# Patient Record
Sex: Female | Born: 1935 | Race: White | Hispanic: No | Marital: Married | Smoking: Never smoker
Health system: Southern US, Community
[De-identification: ages and names within clinical notes are randomized; demographics above are authoritative.]

## PROBLEM LIST (undated history)

## (undated) DIAGNOSIS — M1990 Unspecified osteoarthritis, unspecified site: Secondary | ICD-10-CM

## (undated) DIAGNOSIS — G309 Alzheimer's disease, unspecified: Secondary | ICD-10-CM

## (undated) DIAGNOSIS — E039 Hypothyroidism, unspecified: Secondary | ICD-10-CM

## (undated) DIAGNOSIS — E079 Disorder of thyroid, unspecified: Secondary | ICD-10-CM

## (undated) DIAGNOSIS — M109 Gout, unspecified: Secondary | ICD-10-CM

## (undated) DIAGNOSIS — R29898 Other symptoms and signs involving the musculoskeletal system: Secondary | ICD-10-CM

## (undated) DIAGNOSIS — F0280 Dementia in other diseases classified elsewhere without behavioral disturbance: Secondary | ICD-10-CM

## (undated) DIAGNOSIS — R413 Other amnesia: Secondary | ICD-10-CM

## (undated) DIAGNOSIS — C801 Malignant (primary) neoplasm, unspecified: Secondary | ICD-10-CM

## (undated) DIAGNOSIS — R32 Unspecified urinary incontinence: Secondary | ICD-10-CM

## (undated) DIAGNOSIS — M069 Rheumatoid arthritis, unspecified: Secondary | ICD-10-CM

## (undated) HISTORY — PX: OTHER SURGICAL HISTORY: SHX169

## (undated) HISTORY — PX: ABDOMINAL HYSTERECTOMY: SHX81

---

## 1972-10-09 HISTORY — PX: MASTECTOMY: SHX3

## 1972-10-09 HISTORY — PX: BREAST SURGERY: SHX581

## 2000-05-30 ENCOUNTER — Encounter: Admission: RE | Admit: 2000-05-30 | Discharge: 2000-07-09 | Payer: Self-pay | Admitting: Oncology

## 2000-06-18 ENCOUNTER — Other Ambulatory Visit: Admission: RE | Admit: 2000-06-18 | Discharge: 2000-06-18 | Payer: Self-pay | Admitting: Obstetrics and Gynecology

## 2002-05-16 ENCOUNTER — Other Ambulatory Visit: Admission: RE | Admit: 2002-05-16 | Discharge: 2002-05-16 | Payer: Self-pay | Admitting: Obstetrics and Gynecology

## 2002-07-04 ENCOUNTER — Encounter: Admission: RE | Admit: 2002-07-04 | Discharge: 2002-07-04 | Payer: Self-pay | Admitting: Obstetrics and Gynecology

## 2002-07-04 ENCOUNTER — Encounter: Payer: Self-pay | Admitting: Obstetrics and Gynecology

## 2004-06-23 ENCOUNTER — Other Ambulatory Visit: Admission: RE | Admit: 2004-06-23 | Discharge: 2004-06-23 | Payer: Self-pay | Admitting: Obstetrics and Gynecology

## 2006-02-23 ENCOUNTER — Ambulatory Visit: Payer: Self-pay | Admitting: Oncology

## 2006-03-01 ENCOUNTER — Encounter: Payer: Self-pay | Admitting: Vascular Surgery

## 2006-03-01 ENCOUNTER — Ambulatory Visit: Admission: RE | Admit: 2006-03-01 | Discharge: 2006-03-01 | Payer: Self-pay | Admitting: Oncology

## 2006-03-15 ENCOUNTER — Encounter: Admission: RE | Admit: 2006-03-15 | Discharge: 2006-03-15 | Payer: Self-pay | Admitting: Obstetrics and Gynecology

## 2006-04-12 ENCOUNTER — Encounter: Admission: RE | Admit: 2006-04-12 | Discharge: 2006-04-12 | Payer: Self-pay | Admitting: Oncology

## 2006-09-21 ENCOUNTER — Ambulatory Visit: Payer: Self-pay | Admitting: Oncology

## 2007-03-18 ENCOUNTER — Encounter: Admission: RE | Admit: 2007-03-18 | Discharge: 2007-03-18 | Payer: Self-pay | Admitting: Oncology

## 2007-09-20 ENCOUNTER — Ambulatory Visit: Payer: Self-pay | Admitting: Oncology

## 2007-09-24 LAB — CBC WITH DIFFERENTIAL/PLATELET
Basophils Absolute: 0 10*3/uL (ref 0.0–0.1)
Eosinophils Absolute: 0.1 10*3/uL (ref 0.0–0.5)
HCT: 43.4 % (ref 34.8–46.6)
HGB: 14.8 g/dL (ref 11.6–15.9)
LYMPH%: 23.4 % (ref 14.0–48.0)
MCH: 33.1 pg (ref 26.0–34.0)
MCV: 97 fL (ref 81.0–101.0)
MONO%: 9.4 % (ref 0.0–13.0)
NEUT#: 4.4 10*3/uL (ref 1.5–6.5)
NEUT%: 65.1 % (ref 39.6–76.8)
Platelets: 294 10*3/uL (ref 145–400)
RDW: 11.1 % — ABNORMAL LOW (ref 11.3–14.5)

## 2008-03-18 ENCOUNTER — Encounter: Admission: RE | Admit: 2008-03-18 | Discharge: 2008-03-18 | Payer: Self-pay | Admitting: Oncology

## 2008-03-27 ENCOUNTER — Encounter: Admission: RE | Admit: 2008-03-27 | Discharge: 2008-03-27 | Payer: Self-pay | Admitting: Oncology

## 2008-09-23 ENCOUNTER — Ambulatory Visit: Payer: Self-pay | Admitting: Oncology

## 2008-09-25 LAB — CBC WITH DIFFERENTIAL/PLATELET
Basophils Absolute: 0 10*3/uL (ref 0.0–0.1)
Eosinophils Absolute: 0.1 10*3/uL (ref 0.0–0.5)
HCT: 40.9 % (ref 34.8–46.6)
HGB: 13.7 g/dL (ref 11.6–15.9)
NEUT#: 6.1 10*3/uL (ref 1.5–6.5)
NEUT%: 76.7 % (ref 39.6–76.8)
RDW: 13 % (ref 11.3–14.5)
lymph#: 1.2 10*3/uL (ref 0.9–3.3)

## 2009-03-19 ENCOUNTER — Encounter: Admission: RE | Admit: 2009-03-19 | Discharge: 2009-03-19 | Payer: Self-pay | Admitting: Oncology

## 2009-07-14 ENCOUNTER — Encounter (HOSPITAL_COMMUNITY): Admission: RE | Admit: 2009-07-14 | Discharge: 2009-10-12 | Payer: Self-pay | Admitting: Rheumatology

## 2009-09-22 ENCOUNTER — Ambulatory Visit: Payer: Self-pay | Admitting: Oncology

## 2009-09-24 LAB — CBC WITH DIFFERENTIAL/PLATELET
BASO%: 0.2 % (ref 0.0–2.0)
Basophils Absolute: 0 10*3/uL (ref 0.0–0.1)
EOS%: 0.2 % (ref 0.0–7.0)
HCT: 48 % — ABNORMAL HIGH (ref 34.8–46.6)
HGB: 15.4 g/dL (ref 11.6–15.9)
MCH: 31 pg (ref 25.1–34.0)
MCHC: 32.1 g/dL (ref 31.5–36.0)
MONO#: 0.7 10*3/uL (ref 0.1–0.9)
NEUT%: 84.7 % — ABNORMAL HIGH (ref 38.4–76.8)
RDW: 14.1 % (ref 11.2–14.5)
WBC: 12.7 10*3/uL — ABNORMAL HIGH (ref 3.9–10.3)
lymph#: 1.2 10*3/uL (ref 0.9–3.3)

## 2009-11-08 ENCOUNTER — Encounter (HOSPITAL_COMMUNITY): Admission: RE | Admit: 2009-11-08 | Discharge: 2010-02-06 | Payer: Self-pay | Admitting: Rheumatology

## 2010-09-19 ENCOUNTER — Encounter
Admission: RE | Admit: 2010-09-19 | Discharge: 2010-09-19 | Payer: Self-pay | Source: Home / Self Care | Attending: Oncology | Admitting: Oncology

## 2010-10-22 ENCOUNTER — Inpatient Hospital Stay (HOSPITAL_COMMUNITY)
Admission: EM | Admit: 2010-10-22 | Discharge: 2010-10-25 | Payer: Self-pay | Source: Home / Self Care | Attending: Endocrinology | Admitting: Endocrinology

## 2010-10-24 LAB — CBC
HCT: 49.3 % — ABNORMAL HIGH (ref 36.0–46.0)
HCT: 42.5 % (ref 36.0–46.0)
Hemoglobin: 16.2 g/dL — ABNORMAL HIGH (ref 12.0–15.0)
Hemoglobin: 13.6 g/dL (ref 12.0–15.0)
MCH: 33.7 pg (ref 26.0–34.0)
MCH: 32.9 pg (ref 26.0–34.0)
MCHC: 32.9 g/dL (ref 30.0–36.0)
MCHC: 32 g/dL (ref 30.0–36.0)
MCV: 102.5 fL — ABNORMAL HIGH (ref 78.0–100.0)
MCV: 102.7 fL — ABNORMAL HIGH (ref 78.0–100.0)
Platelets: 378 10*3/uL (ref 150–400)
Platelets: 322 10*3/uL (ref 150–400)
RBC: 4.81 MIL/uL (ref 3.87–5.11)
RBC: 4.14 MIL/uL (ref 3.87–5.11)
RDW: 14.2 % (ref 11.5–15.5)
RDW: 14.2 % (ref 11.5–15.5)
WBC: 8.7 10*3/uL (ref 4.0–10.5)
WBC: 7.7 10*3/uL (ref 4.0–10.5)

## 2010-10-24 LAB — POCT CARDIAC MARKERS
CKMB, poc: <1 ng/mL — ABNORMAL LOW (ref 1.0–8.0)
Myoglobin, poc: 75.7 ng/mL (ref 12–200)
Troponin i, poc: <0.05 ng/mL (ref 0.00–0.09)

## 2010-10-24 LAB — COMPREHENSIVE METABOLIC PANEL
ALT: 12 U/L (ref 0–35)
AST: 22 U/L (ref 0–37)
Albumin: 3 g/dL — ABNORMAL LOW (ref 3.5–5.2)
Alkaline Phosphatase: 50 U/L (ref 39–117)
BUN: 9 mg/dL (ref 6–23)
CO2: 27 mEq/L (ref 19–32)
Calcium: 8.9 mg/dL (ref 8.4–10.5)
Chloride: 111 mEq/L (ref 96–112)
Creatinine, Ser: 0.92 mg/dL (ref 0.4–1.2)
GFR calc Af Amer: >60 mL/min (ref >60)
GFR calc non Af Amer: 59 mL/min — ABNORMAL LOW (ref >60)
Glucose, Bld: 90 mg/dL (ref 70–99)
Potassium: 3.9 mEq/L (ref 3.5–5.1)
Sodium: 143 mEq/L (ref 135–145)
Total Bilirubin: 0.6 mg/dL (ref 0.3–1.2)
Total Protein: 5.4 g/dL — ABNORMAL LOW (ref 6.0–8.3)

## 2010-10-24 LAB — TSH: TSH: 0.162 u[IU]/mL — ABNORMAL LOW (ref 0.350–4.500)

## 2010-10-24 LAB — URINE MICROSCOPIC-ADD ON

## 2010-10-24 LAB — POCT I-STAT, CHEM 8
BUN: 12 mg/dL (ref 6–23)
Calcium, Ion: 1.14 mmol/L (ref 1.12–1.32)
Chloride: 103 mEq/L (ref 96–112)
Creatinine, Ser: 1 mg/dL (ref 0.4–1.2)
Glucose, Bld: 116 mg/dL — ABNORMAL HIGH (ref 70–99)
HCT: 51 % — ABNORMAL HIGH (ref 36.0–46.0)
Hemoglobin: 17.3 g/dL — ABNORMAL HIGH (ref 12.0–15.0)
Potassium: 3.4 mEq/L — ABNORMAL LOW (ref 3.5–5.1)
Sodium: 143 mEq/L (ref 135–145)
TCO2: 28 mmol/L (ref 0–100)

## 2010-10-24 LAB — DIFFERENTIAL
Basophils Absolute: 0 10*3/uL (ref 0.0–0.1)
Basophils Relative: 0 % (ref 0–1)
Eosinophils Absolute: 0.1 10*3/uL (ref 0.0–0.7)
Eosinophils Relative: 1 % (ref 0–5)
Lymphocytes Relative: 16 % (ref 12–46)
Lymphs Abs: 1.4 10*3/uL (ref 0.7–4.0)
Monocytes Absolute: 1.2 10*3/uL — ABNORMAL HIGH (ref 0.1–1.0)
Monocytes Relative: 14 % — ABNORMAL HIGH (ref 3–12)
Neutro Abs: 6.1 10*3/uL (ref 1.7–7.7)
Neutrophils Relative %: 70 % (ref 43–77)

## 2010-10-24 LAB — URINALYSIS, ROUTINE W REFLEX MICROSCOPIC
Bilirubin Urine: NEGATIVE
Ketones, ur: 15 mg/dL — AB
Nitrite: POSITIVE — AB
Protein, ur: NEGATIVE mg/dL
Specific Gravity, Urine: >1.046 — ABNORMAL HIGH (ref 1.005–1.030)
Urine Glucose, Fasting: NEGATIVE mg/dL
Urobilinogen, UA: 0.2 mg/dL (ref 0.0–1.0)
pH: 7 (ref 5.0–8.0)

## 2010-10-24 LAB — PROTIME-INR
INR: 1 (ref 0.00–1.49)
Prothrombin Time: 13.4 seconds (ref 11.6–15.2)

## 2010-10-24 LAB — CORTISOL: Cortisol, Plasma: 14.9 ug/dL

## 2010-10-24 LAB — CORTISOL-AM, BLOOD: Cortisol - AM: 13.8 ug/dL (ref 4.3–22.4)

## 2010-10-26 LAB — BASIC METABOLIC PANEL
BUN: 8 mg/dL (ref 6–23)
CO2: 26 mEq/L (ref 19–32)
Calcium: 8.6 mg/dL (ref 8.4–10.5)
Chloride: 107 mEq/L (ref 96–112)
Creatinine, Ser: 0.87 mg/dL (ref 0.4–1.2)
GFR calc Af Amer: >60 mL/min (ref >60)
GFR calc non Af Amer: >60 mL/min (ref >60)
Glucose, Bld: 88 mg/dL (ref 70–99)
Potassium: 3.6 mEq/L (ref 3.5–5.1)
Sodium: 137 mEq/L (ref 135–145)

## 2010-10-26 LAB — CBC
HCT: 42.1 % (ref 36.0–46.0)
Hemoglobin: 13.4 g/dL (ref 12.0–15.0)
MCH: 32.7 pg (ref 26.0–34.0)
MCHC: 31.8 g/dL (ref 30.0–36.0)
MCV: 102.7 fL — ABNORMAL HIGH (ref 78.0–100.0)
Platelets: 306 10*3/uL (ref 150–400)
RBC: 4.1 MIL/uL (ref 3.87–5.11)
RDW: 14.3 % (ref 11.5–15.5)
WBC: 8 10*3/uL (ref 4.0–10.5)

## 2010-10-26 LAB — URINE CULTURE
Colony Count: >=100000
Culture  Setup Time: 201201142021

## 2010-11-06 ENCOUNTER — Inpatient Hospital Stay (HOSPITAL_COMMUNITY)
Admission: EM | Admit: 2010-11-06 | Discharge: 2010-11-14 | DRG: 641 | Disposition: A | Discharge status: Rehabilitation Facility | Payer: Medicare Other | Attending: Internal Medicine | Admitting: Internal Medicine

## 2010-11-06 DIAGNOSIS — M810 Age-related osteoporosis without current pathological fracture: Secondary | ICD-10-CM | POA: Diagnosis present

## 2010-11-06 DIAGNOSIS — Z853 Personal history of malignant neoplasm of breast: Secondary | ICD-10-CM

## 2010-11-06 DIAGNOSIS — R42 Dizziness and giddiness: Secondary | ICD-10-CM | POA: Diagnosis present

## 2010-11-06 DIAGNOSIS — G3184 Mild cognitive impairment, so stated: Secondary | ICD-10-CM | POA: Diagnosis present

## 2010-11-06 DIAGNOSIS — E039 Hypothyroidism, unspecified: Secondary | ICD-10-CM | POA: Diagnosis present

## 2010-11-06 DIAGNOSIS — R627 Adult failure to thrive: Principal | ICD-10-CM | POA: Diagnosis present

## 2010-11-06 DIAGNOSIS — M069 Rheumatoid arthritis, unspecified: Secondary | ICD-10-CM | POA: Diagnosis present

## 2010-11-06 DIAGNOSIS — K5900 Constipation, unspecified: Secondary | ICD-10-CM | POA: Diagnosis present

## 2010-11-06 DIAGNOSIS — R5381 Other malaise: Secondary | ICD-10-CM | POA: Diagnosis present

## 2010-11-06 DIAGNOSIS — E871 Hypo-osmolality and hyponatremia: Secondary | ICD-10-CM | POA: Diagnosis present

## 2010-11-06 DIAGNOSIS — R51 Headache: Secondary | ICD-10-CM | POA: Diagnosis present

## 2010-11-06 LAB — CBC
HCT: 45.1 % (ref 36.0–46.0)
Hemoglobin: 14.8 g/dL (ref 12.0–15.0)
MCH: 32.7 pg (ref 26.0–34.0)
MCHC: 32.8 g/dL (ref 30.0–36.0)
MCV: 99.8 fL (ref 78.0–100.0)
Platelets: 266 10*3/uL (ref 150–400)
RBC: 4.52 MIL/uL (ref 3.87–5.11)
RDW: 13.5 % (ref 11.5–15.5)
WBC: 6.3 10*3/uL (ref 4.0–10.5)

## 2010-11-06 LAB — DIFFERENTIAL
Basophils Absolute: 0 10*3/uL (ref 0.0–0.1)
Basophils Relative: 1 % (ref 0–1)
Eosinophils Absolute: 0 10*3/uL (ref 0.0–0.7)
Eosinophils Relative: 0 % (ref 0–5)
Lymphocytes Relative: 14 % (ref 12–46)
Lymphs Abs: 0.9 10*3/uL (ref 0.7–4.0)
Monocytes Absolute: 0.6 10*3/uL (ref 0.1–1.0)
Monocytes Relative: 10 % (ref 3–12)
Neutro Abs: 4.8 10*3/uL (ref 1.7–7.7)
Neutrophils Relative %: 75 % (ref 43–77)

## 2010-11-06 LAB — BASIC METABOLIC PANEL
BUN: 9 mg/dL (ref 6–23)
Creatinine, Ser: 0.77 mg/dL (ref 0.4–1.2)
GFR calc non Af Amer: >60 mL/min (ref >60)

## 2010-11-06 LAB — URINALYSIS, ROUTINE W REFLEX MICROSCOPIC
Ketones, ur: 40 mg/dL — AB
Nitrite: NEGATIVE
Protein, ur: NEGATIVE mg/dL
Urobilinogen, UA: 0.2 mg/dL (ref 0.0–1.0)

## 2010-11-06 LAB — POCT CARDIAC MARKERS
CKMB, poc: <1 ng/mL — ABNORMAL LOW (ref 1.0–8.0)
Myoglobin, poc: 30.1 ng/mL (ref 12–200)
Troponin i, poc: <0.05 ng/mL (ref 0.00–0.09)

## 2010-11-07 LAB — BASIC METABOLIC PANEL
BUN: 8 mg/dL (ref 6–23)
BUN: 11 mg/dL (ref 6–23)
CO2: 28 mEq/L (ref 19–32)
CO2: 27 mEq/L (ref 19–32)
Chloride: 96 mEq/L (ref 96–112)
Chloride: 94 mEq/L — ABNORMAL LOW (ref 96–112)
Creatinine, Ser: 0.77 mg/dL (ref 0.4–1.2)
Creatinine, Ser: 1 mg/dL (ref 0.4–1.2)
GFR calc Af Amer: >60 mL/min (ref >60)

## 2010-11-07 LAB — TSH: TSH: 0.224 u[IU]/mL — ABNORMAL LOW (ref 0.350–4.500)

## 2010-11-08 LAB — URINALYSIS, MICROSCOPIC ONLY
Bilirubin Urine: NEGATIVE
Ketones, ur: 15 mg/dL — AB
Protein, ur: NEGATIVE mg/dL
Urobilinogen, UA: 0.2 mg/dL (ref 0.0–1.0)

## 2010-11-08 LAB — BASIC METABOLIC PANEL
CO2: 27 mEq/L (ref 19–32)
Calcium: 8.3 mg/dL — ABNORMAL LOW (ref 8.4–10.5)
Creatinine, Ser: 0.83 mg/dL (ref 0.4–1.2)
GFR calc Af Amer: >60 mL/min (ref >60)

## 2010-11-08 LAB — URINE CULTURE
Colony Count: NO GROWTH
Culture  Setup Time: 201201300119

## 2010-11-08 NOTE — H&P (Signed)
NAMESHANDREA, LUSK               ACCOUNT NO.:  [id number]  MEDICAL RECORD NO.:  [id number]           PATIENT TYPE:  LOCATION:                                 FACILITY:  PHYSICIAN:  Tera Mater. Evlyn Kanner, M.D. DATE OF BIRTH:  12/05/1926  DATE OF ADMISSION: DATE OF DISCHARGE:                             HISTORY & PHYSICAL   [name] is an 77 year old white female we recently had in the hospital from October 22, 2010, to October 25, 2010.  She now presents with recurrent dizziness, weakness, and difficulty in walking.  The patient states she has not been eating and drinking well.  She has eaten virtually nothing but only taking fluids in.  She started feeling bad last Thursday and basically has been getting more dizzy.  She had the impression that the medication that she had been taking for dizziness (meclizine) might be making things worse.  She has stopped the meclizine because of this.  She finished out her Cipro as recommended at the last discharge summary.  She has not had any nausea and vomiting.  She has not had an actual fall but got so weak in the bathroom that she had to be helped to the bed.  She notes some headache today which is a new problem.  She has not had any visual change.  She notes her breathing is at baseline.  She has had no chest pain.  Her bowels have not worked in several days.  She has had no fevers, chills, or sweats.  She is just globally weak.  She is actually being a bit slow to talk with me this morning compared to her baseline which could be related to the low sodium found down in the emergency room.  Her past medical history includes a distant history of breast cancer, osteoporosis, impaired fasting glucose, rheumatoid arthritis, hypothyroidism.  She has had trouble with weight loss in the past and trouble keeping her weight up.  She has had macrocytic anemia.  Her medications at discharge recently included the meclizine and Cipro which was  stopped, folic acid.  She is still on methotrexate.  She takes an intramuscular injection every Sunday but has not had any for 2 weeks.   Her thyroid dose is now 75 mcg, tramadol 50 mcg.  She has previously been  on Evista but has stopped that.  She has previously been on Caltrate.   She should be on prednisone 5 daily, it is unclear whether she is still taking that. (family reports she is not)  Surgical history includes a mastectomy in 1974 with a T2N0 lesion at that time.  She had chemo and radiation.   FH: Sister died with ovarian cancer, another sister died with Alzheimer's.   Mom had diabetes and heart disease.  Brother had diabetes.   SH: She is married with two sons, three grandsons. Non smoker  Her allergies include SULFA, MELOXICAM, and DICLOFENAC.  PHYSICAL EXAMINATION:  VITAL SIGNS:  Here at the hospital, blood pressure is 137/83, pulse 90, respirations 16, temperature 98, O2 sat 94%. GENERAL:  We have a frail, thin white female lying quietly  on her back in no distress. HEENT:  Extraocular movements are intact with no nystagmus.  Face is generally symmetric.  Oral mucous membranes are somewhat dry.  There is no evidence of any oral thrush. NECK:  Supple.  I cannot appreciate any bruits. LUNGS:  Clear, somewhat distant.  No wheezes, rales, or rhonchi are present.  No accessory muscles are in use. HEART:  Regular and hyperdynamic, somewhat fast with systolic murmur. ABDOMEN:  Soft, nontender.  I cannot feel liver or spleen.  Bowel sounds are present. EXTREMITIES:  Thin extremities with no real edema.  Distal pulses relatively well preserved.  The patient is awake.  Mentation is fair. She is a bit slow to answer, but when she got going she was able to answer my questions fairly well. SKIN:  Thin with some bruises.  LABORATORY DATA:  Urinalysis showed negative glucose, small bilirubin, 40 ketones, large blood.  She had 0-2 reds and 0-2 whites.  Sodium was 128, potassium  3.5, chloride 91, CO2 26, BUN 9, creatinine 0.77, glucose of 114.  Estimated GFR greater than 60, calcium 8.5.  White count 6300, hemoglobin 14.8, platelets 266,000.  CK-MB was less than 1, troponin was less than 0.5, myoglobin point of care was 30.  A chest x-ray shows stable emphysematous changes.  A CT of the head done last night showed stable age-related cerebral atrophy with ventriculomegaly, impaired ventricular white matter disease.  No acute intracranial findings.  In summary, I have an 77 year old white female presenting with failure to thrive, weakness, and persistent dizziness.  We have already checked some morning cortisol to see if she is deficient in this.  The hyponatremia is also being rechecked.  I think some of the slow mentation may be due to that plus may be some beginning mild cognitive impairment.  Clearly, she is in a declining state.  We need to work out what is going on here.  It is probably not likely that she will go back to the assisted living but need to be in a more skilled facility.  At the present time, there is no evidence of any infection.  The fact that her dipstick is positive for blood but few cells are present makes me want to check a CK to make sure there is not a rhabdomyolysis.  She has volume depleted a bit, and we will have to work out which medications are most appropriate for her rheumatoid arthritis.  The headaches are new but the CT scan is negative.  These do not appear to blood pressure related.  She does not really have any sinus symptoms.  May have to get Neurology involved if the dizziness persists.  Blood sugars are up a little bit but not bad.          ______________________________ Tera Mater. Evlyn Kanner, M.D.     SAS/MEDQ  D:  11/07/2010  T:  11/07/2010  Job:  [id number]  Electronically Signed by Adrian Prince M.D. on 11/08/2010 10:06:51 PM

## 2010-11-09 LAB — BASIC METABOLIC PANEL
Chloride: 110 mEq/L (ref 96–112)
GFR calc Af Amer: >60 mL/min (ref >60)
Potassium: 4.1 mEq/L (ref 3.5–5.1)
Sodium: 141 mEq/L (ref 135–145)

## 2010-11-11 ENCOUNTER — Inpatient Hospital Stay (HOSPITAL_COMMUNITY): Payer: Medicare Other

## 2010-11-11 DIAGNOSIS — H8110 Benign paroxysmal vertigo, unspecified ear: Secondary | ICD-10-CM

## 2010-11-11 DIAGNOSIS — R627 Adult failure to thrive: Secondary | ICD-10-CM

## 2010-11-11 LAB — BASIC METABOLIC PANEL
BUN: 5 mg/dL — ABNORMAL LOW (ref 6–23)
BUN: 6 mg/dL (ref 6–23)
CO2: 27 mEq/L (ref 19–32)
Chloride: 111 mEq/L (ref 96–112)
Chloride: 105 mEq/L (ref 96–112)
Creatinine, Ser: 0.8 mg/dL (ref 0.4–1.2)
GFR calc Af Amer: >60 mL/min (ref >60)
Glucose, Bld: 95 mg/dL (ref 70–99)
Potassium: 6.8 mEq/L (ref 3.5–5.1)
Potassium: 3.8 mEq/L (ref 3.5–5.1)
Sodium: 143 mEq/L (ref 135–145)

## 2010-11-11 LAB — CBC
HCT: 42.5 % (ref 36.0–46.0)
MCV: 98.2 fL (ref 78.0–100.0)
RBC: 4.33 MIL/uL (ref 3.87–5.11)
WBC: 6.6 10*3/uL (ref 4.0–10.5)

## 2010-11-11 MED ORDER — GADOBENATE DIMEGLUMINE 529 MG/ML IV SOLN
7.0000 mL | Freq: Once | INTRAVENOUS | Status: AC | PRN
  Administered 2010-11-11: 7 mL via INTRAVENOUS

## 2010-11-13 LAB — URINALYSIS, ROUTINE W REFLEX MICROSCOPIC
Ketones, ur: NEGATIVE mg/dL
Leukocytes, UA: NEGATIVE
Nitrite: NEGATIVE
Specific Gravity, Urine: 1.012 (ref 1.005–1.030)
Urine Glucose, Fasting: NEGATIVE mg/dL
pH: 6 (ref 5.0–8.0)

## 2010-11-13 LAB — URINE MICROSCOPIC-ADD ON

## 2010-11-14 ENCOUNTER — Inpatient Hospital Stay (HOSPITAL_COMMUNITY)
Admission: AD | Admit: 2010-11-14 | Discharge: 2010-11-25 | DRG: 946 | Disposition: A | Discharge status: Home Health | Payer: Medicare Other | Source: Ambulatory Visit | Attending: Physical Medicine & Rehabilitation | Admitting: Physical Medicine & Rehabilitation

## 2010-11-14 DIAGNOSIS — M069 Rheumatoid arthritis, unspecified: Secondary | ICD-10-CM

## 2010-11-14 DIAGNOSIS — R5381 Other malaise: Secondary | ICD-10-CM

## 2010-11-14 DIAGNOSIS — Z853 Personal history of malignant neoplasm of breast: Secondary | ICD-10-CM

## 2010-11-14 DIAGNOSIS — Z5189 Encounter for other specified aftercare: Secondary | ICD-10-CM

## 2010-11-14 DIAGNOSIS — Z79899 Other long term (current) drug therapy: Secondary | ICD-10-CM

## 2010-11-14 DIAGNOSIS — E039 Hypothyroidism, unspecified: Secondary | ICD-10-CM

## 2010-11-14 DIAGNOSIS — H8110 Benign paroxysmal vertigo, unspecified ear: Secondary | ICD-10-CM

## 2010-11-14 DIAGNOSIS — D539 Nutritional anemia, unspecified: Secondary | ICD-10-CM

## 2010-11-14 DIAGNOSIS — R3915 Urgency of urination: Secondary | ICD-10-CM

## 2010-11-14 LAB — URINE CULTURE: Culture  Setup Time: 201202051953

## 2010-11-14 NOTE — Discharge Summary (Signed)
[name], [name]               ACCOUNT NO.:  [id number]  MEDICAL RECORD NO.:  [id number]          PATIENT TYPE:  INP  LOCATION:  5022                         FACILITY:  [facility]  PHYSICIAN:  Tera Mater. Evlyn Kanner, M.D. DATE OF BIRTH:  [birth year]  DATE OF ADMISSION:  11/06/2010 DATE OF DISCHARGE:  11/14/2010                              DISCHARGE SUMMARY   Discharge diagnoses are as follows: 1. Unstable gait with persistent dizziness, now clinically improved. 2. General failure to thrive with weight loss. 3. Suspected mild cognitive impairment. 4. Significant hyponatremia causing confusion, now clinically     resolved. 5. Frequent urination with no evidence of infection or other pathology     evident. 6. Rheumatoid arthritis with initiation of Enbrel during this     hospitalization without difficulty. 7. History of distant breast cancer with no evidence of recurrence. 8. Impaired fasting glucose with stable status. 9. Osteoporosis. 10.Hypothyroidism. 11.Macrocytic anemia.  Consultations were with Neurology, Melvyn Novas, MD and the Rehab Service.  Procedures included an MRI of the brain on November 11, 2010, and a CT of the head at presentation.  Ms. [name] is an 77 year old white female, longstanding patient of my practice.  She presented with inability to walk, unsteady gait, and significant hyponatremia.  There probably were some volume deficits as well.  The patient had not been eating and drinking.  This came on the heels of another hospitalization just about 10 days before with similar kind of complaints.  In the interim, the patient generally had failure to thrive, was being helped a round and actually had to be helped out of the bathroom on the day prior to admission.  We suspected that it might have been some infectious source, but did not find any.  The patient has been slow, but steadily making progress.  Her dizziness persisted in the first couple of days.  There  had been some question as whether the meclizine helped or made it worse.  It was not a true allergy to meclizine as it was incorrectly reported.  The patient has been generally slow to progress.  We were seeing the end result of several months of failure to thrive.  The hyponatremia was a new wrinkle in the medical problem list.  The patient did well over this past weekend.  She is able to walk around the circle here on 5000.  She had no dizziness during that.  She has some mild constipation at the present time.  Her joint complaints are actually doing better and she got her first dose of Enbrel a week ago.  Her blood pressure is 118/77, pulse is 100, respirations 16, temperature 98.4, O2 sats 95%.  She also had headaches, now clinically improved.  LABORATORY DATA:  A urine microscopic yesterday was 0-2 reds and no whites.  Urinalysis showed trace blood, otherwise negative.  A morning cortisol was normal at 15.1 on November 11, 2010.  Her chemistries on November 11, 2010, were sodium 140, potassium 3.8, chloride 105, CO2 of 27, BUN 6, creatinine 0.80, glucose of 158.  Note that this is postprandially, one had been drawn earlier at 5:22  and a potassium was furiously high at 6.8.  White count was 6600, hemoglobin 13.8, platelets 250,000.  At presentation for this admission, troponin point-of-care was less than 0.05, myoglobin point-of-care was 30.  Initial white count was 6300, hemoglobin 14.8, platelets 266,000.  Initial chemistries; sodium 128, potassium 3.5, chloride 91, CO2 of 26, BUN 9, creatinine 0.77, glucose of 114, calcium 8.5.  Urinalysis did show large blood in a cath specimen, but 0-2 whites and 0-2 reds.  Subsequent CK did not confirm any rhabdomyolysis.  A CK was normal at 42.  A cortisol was done for the failure to thrive and was 27.7 and repeat was 15.  A TSH was mildly suppressed at 0.224.  I do not think that has any impact here.  RADIOLOGY TESTING:  When Dr. Vickey Huger saw  her, they thought a repeat MRI should be done and there was no change from the study 2 weeks ago.  No signs of acute infarction, confluent small vessel disease was seen throughout the hemispheric white matter.  Her chest x-ray showed no acute findings, stable emphysematous changes on November 06, 2010.  A CT of the head without contrast shows stable age-related cerebral atrophy, ventriculomegaly, and periventricular white matter disease at presentation.  In summary, we have an 77 year old white female with 2 back-to-back hospitalizations with gait disorder and weakness, failure to thrive, and dizziness.  At this time, we had the issue of hyponatremia as well.  We have done an extensive workup neurologically and confined no specific thing going on.  I do suspect just when talking to her during this hospitalization that there may be a component of mild cognitive impairment.  The patient has been on a lot of other medicines that we stopped, however, and there may be some relationship there as well. Now, we have initiated Enbrel during this hospitalization and she is due for her second dose tomorrow.  The family has the medicine at home andcan basically bring it in if needed.  The dose she got a week ago was 50 mg subcu x1 and that would be the same dose at this time obviously.  We have minimized her medication list as noted.  She is to stop the methotrexate.  We will stop that and folic acid and let her decide.  We started the Enbrel as noted.  She is on Levothroid 75 mcg and probably needs a repeat TSH to see if she needs a slight dose reduction.  She is on Lovenox until she becomes more mobile than has been 30 mg and she is on Tylenol q.4 h. p.r.n.  The other thing we should start up that we have not been giving her is a little bit of MiraLax 17 g once daily. This would seem reasonable with her constipation that has now developed over the weekend here, but we have not required any stronger  pain medications.  Her diet should be no concentrated sweets.  I do not have her on a sliding scale, she has had a normal A1c, we do not need to really worry much about that at the present time with her weight loss. She is going to [facility].  She does need physical and occupational therapy.  I have also recommended a mini-mental status exam which could not be completed on the acute care side.          ______________________________ [name], M.D.     SAS/MEDQ  D:  11/14/2010  T:  11/14/2010  Job:  [id number]  Electronically Signed by Adrian Prince M.D. on 11/14/2010 07:01:30 PM

## 2010-11-15 DIAGNOSIS — R5381 Other malaise: Secondary | ICD-10-CM

## 2010-11-15 DIAGNOSIS — Z5189 Encounter for other specified aftercare: Secondary | ICD-10-CM

## 2010-11-15 DIAGNOSIS — M069 Rheumatoid arthritis, unspecified: Secondary | ICD-10-CM

## 2010-11-15 DIAGNOSIS — H53139 Sudden visual loss, unspecified eye: Secondary | ICD-10-CM

## 2010-11-15 LAB — COMPREHENSIVE METABOLIC PANEL
Albumin: 2.8 g/dL — ABNORMAL LOW (ref 3.5–5.2)
Alkaline Phosphatase: 86 U/L (ref 39–117)
BUN: 11 mg/dL (ref 6–23)
CO2: 29 mEq/L (ref 19–32)
Chloride: 105 mEq/L (ref 96–112)
Creatinine, Ser: 0.78 mg/dL (ref 0.4–1.2)
GFR calc non Af Amer: >60 mL/min (ref >60)
Glucose, Bld: 101 mg/dL — ABNORMAL HIGH (ref 70–99)
Total Bilirubin: 0.6 mg/dL (ref 0.3–1.2)

## 2010-11-15 LAB — DIFFERENTIAL
Eosinophils Absolute: 0.2 10*3/uL (ref 0.0–0.7)
Eosinophils Relative: 2 % (ref 0–5)
Lymphocytes Relative: 30 % (ref 12–46)
Lymphs Abs: 3 10*3/uL (ref 0.7–4.0)
Monocytes Relative: 14 % — ABNORMAL HIGH (ref 3–12)
Neutrophils Relative %: 54 % (ref 43–77)

## 2010-11-15 LAB — CBC
HCT: 44.6 % (ref 36.0–46.0)
MCH: 33.1 pg (ref 26.0–34.0)
MCV: 101.1 fL — ABNORMAL HIGH (ref 78.0–100.0)
Platelets: 354 10*3/uL (ref 150–400)
RBC: 4.41 MIL/uL (ref 3.87–5.11)

## 2010-11-21 LAB — BASIC METABOLIC PANEL
Calcium: 9.2 mg/dL (ref 8.4–10.5)
GFR calc Af Amer: >60 mL/min (ref >60)
GFR calc non Af Amer: >60 mL/min (ref >60)
Potassium: 4 mEq/L (ref 3.5–5.1)
Sodium: 141 mEq/L (ref 135–145)

## 2010-11-21 LAB — TSH: TSH: 2.069 u[IU]/mL (ref 0.350–4.500)

## 2010-11-22 LAB — URINE MICROSCOPIC-ADD ON

## 2010-11-22 LAB — URINALYSIS, ROUTINE W REFLEX MICROSCOPIC
Nitrite: NEGATIVE
Protein, ur: NEGATIVE mg/dL
Specific Gravity, Urine: 1.012 (ref 1.005–1.030)
Urobilinogen, UA: 1 mg/dL (ref 0.0–1.0)

## 2010-11-23 DIAGNOSIS — R5381 Other malaise: Secondary | ICD-10-CM

## 2010-11-23 DIAGNOSIS — H8110 Benign paroxysmal vertigo, unspecified ear: Secondary | ICD-10-CM

## 2010-11-23 DIAGNOSIS — Z5189 Encounter for other specified aftercare: Secondary | ICD-10-CM

## 2010-11-23 DIAGNOSIS — M069 Rheumatoid arthritis, unspecified: Secondary | ICD-10-CM

## 2010-11-23 LAB — URINE CULTURE
Culture: NO GROWTH
Special Requests: NORMAL

## 2010-11-23 NOTE — H&P (Signed)
[name], [name]               ACCOUNT NO.:  [id number]  MEDICAL RECORD NO.:  [id number]          PATIENT TYPE:  EMS  LOCATION:  MAJO                         FACILITY:  [facility]  PHYSICIAN:  Larina Earthly, M.D.        DATE OF BIRTH:  Jul 28, 1927  DATE OF ADMISSION:  10/22/2010 DATE OF DISCHARGE:                             HISTORY & PHYSICAL   CHIEF COMPLAINT:  Nausea, weakness and syncope x1 with prolonged dizziness.  HISTORY OF PRESENT ILLNESS:  This is an 77 year old Caucasian female who lives at home in an independent manor along with her husband, with a past medical history significant for hypothyroidism, rheumatoid arthritis, impaired fasting glucose, remote breast cancer and osteoporosis who was in the process of initiating Enbrel therapy for rheumatoid arthritis on January 16, next week, with her rheumatologist. As a result of this, she has been off of her other immunosuppressive agents consisting of Cimzia for least 1 month and had her last dose of prednisone approximately 1 week ago.  She continued on her methotrexate and folic acid as usual.  She also takes tramadol p.r.n. for discomfort as well as her daily Synthroid.  She presents with a 1-week history of progressive nausea, anorexia, dizziness with gait instability, with multiple close falls and need of increasing assistance along with one episode of unwitnessed syncope where she fell and landed on the bed approximately 2 days ago, lasting by her report a short period of time given that family members were also in the house at that time.  Throughout this week, she with the episode of syncope, she has not had any chest pain, palpitations, shortness of breath, fevers, chills, tremors, bowel or bladder incontinence.  Given her increasing need for assistance and increasing weakness, she presented to the emergency room here.  Telemetry reveals sinus rhythm with tachycardia of approximately 110, blood pressure with a  systolic blood pressure in excess of 130 and typically 150.  Head CT unremarkable except for small vessel disease.  CT angiogram of the chest was negative for PE.  Chest x-ray unremarkable.  She does have a slightly elevated hemoglobin as dictated below.  White blood cell count was normal. Cardiac enzymes were normal.  EKG unremarkable except for tachycardia. Her BUN was 12 with a creatinine of 1.0 in a very small person. Urinalysis positive for evidence of infection; however, she is relatively asymptomatic except for some momentary dysuria that she notes in retrospect with questioning.  REVIEW OF SYSTEMS:  Again, negative for fevers, chills, new visual changes, focal neurological deficits except for the generalized weakness.  Negative for chest pain, shortness of breath, palpitations. Positive for nausea, no vomiting.  Positive for anorexia.  Negative for diarrhea, abdominal pain.  New musculoskeletal deficits, indeed she states that her rheumatoid arthritis affecting her hands is actually slightly improved.  PROBLEM LIST:1. We have a history of rheumatoid arthritis of multiple years,     followed by Dr. Pollyann Savoy, that has been seronegative in the     past. 2. Osteoporosis dating back to 2003. 3. Hypothyroidism dating back to 1999. 4. History of impaired fasting glucose. 5. Breast  cancer dating back to 1974, T2 N0, 2-cm primary with 11     negative nodes; treated with mastectomy, chemotherapy, and     radiation therapy. 6. History of chronic dizziness per Dr. Rinaldo Cloud notes.  SURGICAL HISTORY:  Significant for mastectomy and total abdominal hysterectomy.  FAMILY HISTORY:  Significant for a sister who died of ovarian cancer. Sister with Alzheimer's dementia.  Mother with diabetes and coronary artery disease and a brother with diabetes.  The patient is married, has two sons and three grandchildren, and resides independently with her husband.  CURRENT MEDICATIONS: 1.  Synthroid 75 mcg each day. 2. Folic acid 2 tablets at bedtime. 3. Methotrexate weekly. 4. Tramadol 50 mg twice daily as needed. 5. Again, the patient has discontinued other rheumatoid arthritis     medications by her report and is no longer taking Evista and has     not taken any prednisone in at least 1-week time.  LABORATORY EVALUATION:  Sodium 143, potassium 3.4, BUN 12, creatinine 1.0, glucose 116, ionized calcium 1.14.  White blood cell count 8.7, hemoglobin 16.2, hematocrit 49.3%, and platelet count 378.  Cardiac enzymes unremarkable x1.  PT 13.4 seconds.  Urinalysis does reveal 11-20 white blood cells, many bacteria, nitrite positive, and leukocyte esterase moderate.  Chest x-ray reveals COPD, no acute findings.  Head CT without contrast reveals chronic microvascular ischemic changes without evidence of acute infarct.  CT angiogram of the chest with contrast reveals no evidence of pulmonary embolism; however, there is a small pericardial effusion as well as scarring in the left lung.  PHYSICAL EXAMINATION:  GENERAL:  We have a pleasant Caucasian female lying in bed, in no apparent distress.  No respiratory distress. Answering all questions appropriately. VITAL SIGNS:  Oxygen saturation 97% on room air, blood pressure 137/94, pulse 105 and regular, respirations 20 and nonlabored, and temperature 97.5 degrees Fahrenheit. HEENT:  Sclerae anicteric.  Extraocular movements are intact.  Tongue is midline.  There are no oropharyngeal lesions.  Face is symmetric. NECK:  Supple.  There is no cervical lymphadenopathy. LUNGS:  Clear to auscultation bilaterally. CARDIOVASCULAR:  Regular rate and rhythm with possibly a soft murmur. ABDOMEN:  Soft, nontender, nondistended abdomen.  Bowel sounds are present.  There is no suprapubic tenderness.  EXTREMITIES:  No edema. Osteoarthritic changes are present in the feet as well as the hands. There is no active synovitis, at least by my exam.  No  cyanosis and no clubbing. NEUROLOGIC:  The patient does move all four extremities.  There are no tremors and general neurologic exam is grossly nonfocal.  ASSESSMENT/PLAN: 1. Presyncope with one episode of syncope along with increasing     dizziness with multiple possibilities in etiologies and possibly a     combination of all below in an immunocompromised individual.     Certainly, a viral syndrome with gastritis with increasing nausea     and anorexia is a possibility, although the patient does not have     any abdominal findings and has not had any fevers, chills or     diarrhea. Adrenal insufficiency is also a possibility given her     intermittent use of prednisone over multiple months and years, with     the last dose being approximately 1 week ago, complicated by a     urinary tract infection as evidenced by urinalysis.  Cardiac     enzymes are normal.  CT angiogram is unremarkable.  Chest x-ray     unremarkable.  We will  provide IV fluids, check orthostatics today     and tomorrow, check a cortisol level with lab work from the ER as     well as that in the morning, and will have a low threshold for     initiating IV steroids for reduced blood pressure or increasing     heart rate.  We will also provide IV antibiotics for above urinary     tract infection and send urine for culture.  We will give     supportive care, and we will follow up on EKG as well as vital     signs with heart rate. 2. Urinary tract infection.  We will provide Cipro empirically and     follow up on culture and sensitivity. 3. Hypothyroidism.  We will continue thyroid supplement.  We will     check TSH. 4. Rheumatoid arthritis.  We will cancel first dose of Enbrel     scheduled for 2 days from now and we will hold methotrexate and we     will use steroids as needed. 5. Provide deep vein thrombosis prophylaxis.     Larina Earthly, M.D.     RA/MEDQ  D:  10/22/2010  T:  10/23/2010  Job:  [id number]  cc:    Jeannett Senior A. Evlyn Kanner, M.D. Pollyann Savoy, M.D.  Electronically Signed by Larina Earthly M.D. on 11/23/2010 09:59:04 PM

## 2010-11-23 NOTE — H&P (Signed)
[name], [name]               ACCOUNT NO.:  [id number]  MEDICAL RECORD NO.:  [id number]           PATIENT TYPE:  I  LOCATION:  4007                         FACILITY:  [facility]  PHYSICIAN:  Ranelle Oyster, M.D.DATE OF BIRTH:  1927/02/14  DATE OF ADMISSION:  11/14/2010 DATE OF DISCHARGE:                             HISTORY & PHYSICAL   PRIMARY CARE PROVIDER:  Tera Mater. Evlyn Kanner, MD  NEUROLOGIST:  Melvyn Novas, MD  CHIEF COMPLAINT:  Weakness and dizziness.  HISTORY OF PRESENT ILLNESS:  An 77 year old white female with rheumatoid arthritis admitted on January 29 with recurrent dizziness.  She had recent admission in January for dizziness with workup negative.  CT showed age-related atrophy.  UA was negative.  Neurology ordered an MRI of the brain and there was no change since scan of January.  Vestibular evaluation was done showing horizontal nystagmus.  Orthostatics were unremarkable.  A vestibular rehab was commenced and the patient showed some improvement.  She still struggles with mobility and balance as well as weakness in the proximal lower extremities.  Rehab was asked to evaluate the patient on February 3 and I felt that she could benefit from inpatient rehab stay.  REVIEW OF SYSTEMS:  Notable for weakness, dizziness, urinary urgency, and frequency.  Full reviews in the written H and P.  PAST MEDICAL HISTORY:  Positive for rheumatoid arthritis, on Enbrel; hypothyroidism; macrocytic anemia; left breast cancer with mastectomy at 77 and hysterectomy.  She denies alcohol or tobacco.  FAMILY HISTORY:  Positive for diabetes and cancer.  SOCIAL HISTORY:  She is married.  Husband can assist except for lifting. She has no other local family.  They have 2-level house with bedroom upstairs.  They have 2 steps to enter the house.  ALLERGIES:  Questionably MECLIZINE.  HOME MEDICATIONS:  Enbrel, tramadol, and Synthroid.  LABORATORY DATA:  In written H and P.  PHYSICAL  EXAMINATION:  VITAL SIGNS:  Blood pressure is 118/77, pulse 100, respiratory rate 16, and temperature 98.4. GENERAL:  The patient is pleasant, alert, and oriented x3.  Affect is generally bright and appropriate. EAR, NOSE AND THROAT:  Notable for intact dentition.  Pink moist mucosa. NECK:  Supple without JVD or lymphadenopathy. CHEST:  Clear to auscultation bilaterally without wheezes, rales, or rhonchi. HEART:  Regular rate and rhythm without murmurs, rubs, or gallops. ABDOMEN:  Soft and nontender. SKIN:  Notable for few chronic skin changes in legs and arms.  She has notable rheumatoid deformities of the hands and wrists with a radially deviated wrist and ulnar deviated fingers. NEUROLOGIC:  Cranial nerves II through XII are notable for nystagmus with lateral gaze 3-4 beats left greater than right.  Reflexes are 1+. Sensation is grossly intact.  Judgment, orientation, memory, and mood were all appropriate.  Strength is 4/5 in the shoulders, 4+ at the elbows 4/5 at the wrist and hands.  Lower extremity strength is2/5 hip flexor, 3+ to 4/5 knee extension and flexion, and 4+/5 ankle dorsiflexion and plantar flexion.  POST ADMISSION PHYSICIAN EVALUATION: 1. Functional deficit secondary to positional vertigo, rheumatoid     arthritis, and deconditioning over the  last few months. 2. The patient is admitted to receive collaborative interdisciplinary     care between the rehab nursing staff and therapy team. 3. The patient's level of medical complexity and substantial therapy     needs in context so that medical necessity cannot be provided at a     lesser intensity of care. 4. The patient has experienced substantial functional loss from her     baseline.  Upon functional assessment at the time of my rehab     consult, the patient was min to mod assist for basic mobility and     transfers.  She is min assist bed mobility still, min assist gait     150 feet rolling walker today and ADLs  have not been tested.     Judging by the patient's diagnosis, physical exam, and functional     history, she has potential for functional progress which will     result in measurable gains while inpatient rehab.  These gains will     be of substantial and practical use upon discharge to home in     facilitating mobility and self-care.  Interim changes since my     rehab consult are detailed above. 5. The physiatrist will provide 24-hour management of medical needs as     well as oversight of the therapy plan/treatment and provide     guidance as appropriate guarding interaction of the two.  Medical     problem list and plan are below. 6. The 24-hour rehab nursing team will assist in management of the     patient's skin care needs as well as bowel and bladder function,     safety awareness, and integration of therapy concepts and     techniques. 7. PT will assess and treat for lower extremity strength, range of     motion, and vestibular treatment and assessment with goals modified     independent. 8. OT will assess and treat for upper extremity use, ADLs, adaptive     technique, functional mobility, safety with goals modified     independent.  Proximal muscle weakness will be a big problem for     both PT and OT during the admission. 9. Case management and social worker will assess and treat for     psychosocial issues and discharge planning. 10.Team conference will be held weekly to assess progress towards     goals and to determine barriers at discharge. 11.The patient has demonstrated sufficient medical stability and     exercise capacity to tolerate at least 3 hours therapy per day at     least 5 days per week. 12.Estimated length stay is about 1 week.  Prognosis is good.  The     patient is motivated.  MEDICAL PROBLEM LIST AND PLAN: 1. Vestibular symptoms:  Vestibular rehab and gaze stabilization     exercises.  We will refrain from medical management of symptoms if      possible. 2. History of rheumatoid arthritis:  We will discuss Dr. Corliss Skains     regarding plans for weekly Enbrel. 3. Hypothyroidism:  Synthroid. 4. Pain management with p.r.n. Tylenol. 5. Urinary urgency.  Check PVRs and recheck urinalysis and culture as     appropriate.     Ranelle Oyster, M.D.     ZTS/MEDQ  D:  11/14/2010  T:  11/15/2010  Job:  [id number]  cc:   Jeannett Senior A. Evlyn Kanner, M.D. Melvyn Novas, M.D.  Electronically Signed by Faith Rogue  M.D. on 11/23/2010 10:02:53 AM

## 2010-11-24 DIAGNOSIS — Z5189 Encounter for other specified aftercare: Secondary | ICD-10-CM

## 2010-11-24 DIAGNOSIS — R5381 Other malaise: Secondary | ICD-10-CM

## 2010-11-24 DIAGNOSIS — M069 Rheumatoid arthritis, unspecified: Secondary | ICD-10-CM

## 2010-11-24 DIAGNOSIS — H8110 Benign paroxysmal vertigo, unspecified ear: Secondary | ICD-10-CM

## 2011-01-05 NOTE — Discharge Summary (Signed)
NAMESHALISE, [name]               ACCOUNT NO.:  [id number]  MEDICAL RECORD NO.:  [id number]           PATIENT TYPE:  I  LOCATION:  4007                         FACILITY:  [facility]  PHYSICIAN:  [name], M.D.DATE OF BIRTH:  1926/12/29  DATE OF ADMISSION:  11/14/2010 DATE OF DISCHARGE:  11/25/2010                              DISCHARGE SUMMARY   DISCHARGE DIAGNOSES: 1. Postural vertigo with rheumatoid arthritis and deconditioning.     Subcutaneous Lovenox for deep vein thrombosis prophylaxis. 2. Hypothyroidism. 3. Urinary stress incontinence. 4. History of left breast cancer.  HISTORY OF PRESENT ILLNESS:  This is an 77 year old female with rheumatoid arthritis, admitted January 29 with recurrent dizziness. Noted recent admit January 14 to October 25, 2010, for dizziness with workup negative.  Cranial CT scan January 29 with age-related atrophy. Urinalysis negative.  Thyroid functions within normal limits.  Neurology consulted.  MRI February 3 showed no change since prior scan of January 15. Vestibular evaluation on February 3, with some horizontal nystagmus. Check of orthostatic blood pressures were unremarkable.  Placed on MECLIZINE prior to admission, but was discontinued secondary to patient allergic to this medication with nausea.  Subcutaneous Lovenox was added for deep vein thrombosis prophylaxis.  She was minimal assist for ambulation.  She was admitted for comprehensive rehab program.  PAST MEDICAL HISTORY:  See discharge diagnoses.  No alcohol or tobacco.  ALLERGIES:  MECLIZINE.  SOCIAL HISTORY:  Married.  Husband can assist on discharge except for limited lifting, no local family.  Two-level home, bedroom upstairs, two steps to entry.  MEDICATIONS PRIOR TO ADMISSION: 1. Enbrel weekly. 2. Tramadol. 3. Synthroid. 4. She had also taken methotrexate in the past for rheumatoid     arthritis.  PHYSICAL EXAMINATION:  VITAL SIGNS:  Blood pressure 118/77, pulse  100, temperature 98.4, and respirations 16. GENERAL:  This was an alert female in no acute distress, oriented x3. NEUROLOGIC:  Deep tendon reflexes 2+.  Sensation intact to light touch. MUSCULOSKELETAL:  Rheumatoid changes to the hands, wrists, and feet. LUNGS:  Clear to auscultation. CARDIAC:  Regular rate and rhythm. ABDOMEN:  Soft, nontender.  Good bowel sounds.  REHABILITATION HOSPITAL COURSE:  The patient was admitted to inpatient rehab services with therapies initiated on a 3-hour daily basis consisting of physical therapy, occupational therapy, and rehabilitation nursing.  The following issues were addressed during patient's rehabilitation stay.  Pertaining to [name]' postural vertigo, rheumatoid arthritis, and deconditioning, remained stable.  Vestibular evaluation essentially unremarkable except for some mild horizontal nystagmus.  She denied any increased bouts of dizziness while on the rehab unit.  In relation to her rheumatoid arthritis.  She was receiving Enbrel per [facility]umatology Services, last received on February 8 as well as February 15.  There was some suggestion of possibly also resuming her methotrexate, which Dr. Corliss Skains of [facility] would address on an outpatient basis.  She had no flare-up of rheumatoid arthritis during her rehab stay.  She remained on subcutaneous Lovenox for deep vein thrombosis prophylaxis throughout her rehab course.  She continued on Synthroid for hypothyroidism.  Latest thyroid function panels were within normal limits  at 2.069.  She did have ongoing complaints of some stress incontinence.  Post-void residuals were within normal limits.  A urine culture on February 5 was negative.  February 14 repeated, with bacteria being few.  Urine culture again showing no growth.  It was at the family's request she see a urologist on an outpatient basis, which was scheduled with Dr. Alfredo Martinez of [facility], [phone]. The  patient received weekly collaborative interdisciplinary team conferences to discuss estimated length of stay, family teaching, and any barriers to her discharge.  She was supervision to bathe, minimal assist to supervision for transfers, supervision upper and lower body activities of daily living, minimal assist to navigate stairs, supervision bed mobility, and supervision transfers.  She exhibited no unsafe behavior.  Her strength and endurance greatly improved throughout her rehab stay.  Family teaching completed.  She was discharged home with ongoing therapies as dictated per rehab services.  LABORATORY DATA:  Latest labs showed sodium 141, potassium 4.0, BUN 13, and creatinine 0.9.  Hemoglobin 14.6, hematocrit 44.6, and platelets 354,000.  DISCHARGE MEDICATIONS:  At time of dictation, included 1. Folic acid 2 mg at bedtime. 2. Synthroid 75 mcg daily. 3. She will continue her Enbrel weekly as directed per [facility]umatology     Services.  DIET:  Her diet was regular.  SPECIAL INSTRUCTIONS:  Home therapies as dictated per Rehab Services. Follow up with [facility] in regards to resuming her methotrexate along with her Enbrel, which she had been receiving during her rehab stay.  She should follow up with Dr. Alfredo Martinez on an outpatient basis for some urinary stress incontinence, which appointment had been made.  She will see Dr. Faith Rogue in the outpatient rehab service office as needed; Dr. Adrian Prince, Medical Management.     Mariam Dollar, P.A.   ______________________________ [name], M.D.    DA/MEDQ  D:  11/24/2010  T:  11/24/2010  Job:  [id number]  cc:   Jeannett Senior A. Evlyn Kanner, M.D. Melvyn Novas, M.D. Sanjeev K. Corliss Skains, M.D. Martina Sinner, MD  Electronically Signed by Mariam Dollar P.A. on 12/28/2010 07:45:39 AM Electronically Signed by Claudette Laws M.D. on 01/05/2011 04:38:26 PM

## 2011-01-05 NOTE — Consult Note (Signed)
NAMECLEON, THOMA               ACCOUNT NO.:  [id number]  MEDICAL RECORD NO.:  [id number]          PATIENT TYPE:  INP  LOCATION:  5022                         FACILITY:  [facility]  PHYSICIAN:  Melvyn Novas, M.D.  DATE OF BIRTH:  [birth year]  DATE OF CONSULTATION: DATE OF DISCHARGE:                                CONSULTATION   REASON FOR CONSULTATION:  Generalized weakness and imbalance.  HISTORY OF PRESENT ILLNESS:  This is an 77 year old Caucasian female who was hospitalized on January 14 through 17 with nausea, vomiting, syncope, along with weakness.  At that time, the patient was found to have UTI with a negative neurological workup.  The patient underwent an MRI, which showed negative for acute infarct.  The patient was seen by physical therapy and at time of discharge, continue to have generalized weakness and difficulty with gait secondary to weakness.  The patient went home was continued on the meclizine that she was taking while in hospital for questionable vertiginous sensation.  While home on the meclizine, the patient felt as though the medication made her dizziness worse, so she took herself off this medication.  The patient returns to the hospital on November 06, 2010, secondary to continued generalized weakness and dizziness.  Neurology was consulted for further evaluation of this patient.  PAST MEDICAL HISTORY:  Rheumatoid arthritis, osteoporosis, breast cancer in 1974 with mastectomy, chemo and radiation, macrocytic anemia, and hypothyroidism.  MEDICATIONS:  The patient is on Synthroid, folic acid, tramadol.  She was on methotrexate every Sunday, but has stopped that 2 weeks ago.  She was recently on meclizine, but again has stopped this medication and also finished her dosage of Cipro for UTI.  ALLERGIES:  SULFA, MELOXICAM, and DICLOFENAC.  FAMILY HISTORY:  Diabetes.  Sister died of ovarian cancer.  A second sister died of Alzheimer's.  SOCIAL HISTORY:   The patient does not smoke, drink or do illicit drugs. She is married, has 2 sons and 3 grandsons.  REVIEW OF SYSTEMS:  The patient is generalized weak, has dizziness when changing position.  Has had nausea, vomiting over the last few weeks. Denies headache, blurred vision, diplopia.  Denies any focal weakness. Denies any lateralizing symptoms.  Denies any neck pain.  PHYSICAL EXAMINATION:  VITAL SIGNS:  Blood pressure is 135/81, pulse 102, respiration 18, temperature 96.8. MENTAL STATUS:  She is alert and orientated.  She carries out 2-3-step commands.  She is able to give a fairly good history.  Pupils are equal, round, reactive to light and accommodating.  Conjugate extraocular movements intact.  Visual fields grossly intact.  Face symmetrical. Tongue is midline.  Uvula is midline.  The patient shows no dysarthria, slurred speech.  Facial sensation is full.  Sensation throughout the lower extremity and upper extremities full to pinprick, light touch. She has slight decrease in vibratory sensation in her toes.  Otherwise, within normal limits.  Shoulder shrug, head turn within normal limits. COORDINATION:  Finger-to-nose and heel-to-shin are smooth. MOTOR:  The patient shows 4/5 strength while lying in bed in her upper extremities, 5/5 strength when lying in bed in her lower extremities. She  does not show any tremor, asterixis or increased tone.  Her deep tendon reflexes are 2+ throughout the upper extremity.  She does have a decreased deep tendon reflexes in her right patella with a normal deep tendon reflex on her left patella.  She has negligible Achilles reflexes and downgoing toes.  The patient shows no drift in the upper or lower extremities. PULMONARY:  Clear to auscultation. HEART:  S1 and S2 are audible. NECK:  Negative for bruits.  Supple.  LABORATORY DATA:  TSH is 0.224.  CK is 42.  UA is negative.  Sodium 141, potassium 4.1, chloride 110, CO2 of 26, BUN 3, creatinine  0.72, glucose is 88.  White blood cell count is 6.3, platelets 266, hemoglobin 14.8, hematocrit 45.1.  Imaging CT of head on this visit showed stable age-related cerebral atrophy, ventriculomegaly, and periventricular white matter disease.  No acute intracranial findings.  The patient's MRI of brain on January 15 showed no acute infarct.  Prominent small vessel disease type changes.  ASSESSMENT:  This is an 77 year old female with 3-week history of generalized weakness, periods of dizziness, mostly associated with laying to sitting and sitting to standing.  The patient states there may have been a vertiginous association.  At present time, the patient is extremely weak when standing but does not show any lateralizing or localizing focal neurological deficits.  At this time, would recommend obtain orthostatic blood pressures to look for orthostatic changes when standing or sitting.  In addition, PT for balance, gait training.  The patient would most likely benefit significantly from either an inpatient or outpatient rehabilitation.  I do not see any further neurological workup.     Felicie Morn, PA-C   ______________________________ Melvyn Novas, M.D.    DS/MEDQ  D:  11/10/2010  T:  11/11/2010  Job:  [id number]  Electronically Signed by Felicie Morn PA-C on 11/24/2010 12:50:03 PM Electronically Signed by Melvyn Novas M.D. on 01/05/2011 12:56:25 PM

## 2011-07-04 ENCOUNTER — Ambulatory Visit: Payer: Medicare Other | Admitting: Dietician

## 2012-04-19 ENCOUNTER — Encounter (HOSPITAL_COMMUNITY): Payer: Self-pay | Admitting: *Deleted

## 2012-04-19 ENCOUNTER — Emergency Department (INDEPENDENT_AMBULATORY_CARE_PROVIDER_SITE_OTHER): Payer: Medicare Other

## 2012-04-19 ENCOUNTER — Emergency Department (INDEPENDENT_AMBULATORY_CARE_PROVIDER_SITE_OTHER)
Admission: EM | Admit: 2012-04-19 | Discharge: 2012-04-19 | Disposition: A | Discharge status: Home / Self Care | Payer: Medicare Other | Source: Home / Self Care | Attending: Emergency Medicine | Admitting: Emergency Medicine

## 2012-04-19 DIAGNOSIS — M109 Gout, unspecified: Secondary | ICD-10-CM

## 2012-04-19 HISTORY — DX: Gout, unspecified: M10.9

## 2012-04-19 HISTORY — DX: Unspecified osteoarthritis, unspecified site: M19.90

## 2012-04-19 HISTORY — DX: Malignant (primary) neoplasm, unspecified: C80.1

## 2012-04-19 HISTORY — DX: Disorder of thyroid, unspecified: E07.9

## 2012-04-19 HISTORY — DX: Rheumatoid arthritis, unspecified: M06.9

## 2012-04-19 MED ORDER — DICLOFENAC SODIUM 1 % TD GEL: 1.0000 "application " | Freq: Four times a day (QID) | TRANSDERMAL | Status: DC

## 2012-04-19 MED ORDER — PREDNISONE 20 MG PO TABS: ORAL_TABLET | ORAL | Status: AC

## 2012-04-19 MED ORDER — COLCHICINE 0.6 MG PO TABS: ORAL_TABLET | ORAL | Status: DC

## 2012-04-19 NOTE — ED Provider Notes (Signed)
History     CSN: [id number]  Arrival date & time 04/19/12  1550   First MD Initiated Contact with Patient 04/19/12 1603      Chief Complaint  Patient presents with  . Hand Pain    (Consider location/radiation/quality/duration/timing/severity/associated sxs/prior treatment) HPI Comments: Patient is a right-handed female with a history of rheumatoid arthritis and gout presents with atraumatic erythema, pain, swelling at the DIP of her right index finger for the past week. No nausea, vomiting, fevers, paresthesias, discoloration around the nail. No erythema streaking up the finger. No other pain or swelling along her hand. She is currently being treated with DMARD for her RA. No History of diabetes. She is not a smoker. She was evaluated by her rheumatologist's PA earlier today, who was concerned about possible paronychia, and so she was sent here for evaluation.  ROS as noted in HPI. All other ROS negative.   Patient is a 77 y.o. female presenting with hand pain. The history is provided by the patient. No language interpreter was used.  Hand Pain This is a new problem. The current episode started more than 2 days ago. The problem occurs constantly. The problem has been gradually worsening. Exacerbated by: use, finger movement. Nothing relieves the symptoms. She has tried nothing for the symptoms. The treatment provided no relief.    Past Medical History  Diagnosis Date  . Arthritis   . Thyroid disease   . Gout   . Rheumatoid arthritis   . Cancer     breast CA    Past Surgical History  Procedure Date  . Breast surgery   . Abdominal hysterectomy     History reviewed. No pertinent family history.  History  Substance Use Topics  . Smoking status: Never Smoker   . Smokeless tobacco: Not on file  . Alcohol Use: No    OB History    Grav Para Term Preterm Abortions TAB SAB Ect Mult Living                  Review of Systems  Allergies  Review of patient's allergies  indicates no known allergies.  Home Medications   Current Outpatient Rx  Name Route Sig Dispense Refill  . ETANERCEPT 50 MG/ML [place] SOLN Subcutaneous Inject 50 mg into the skin once a week.    Marland Kitchen LEVOTHYROXINE SODIUM 75 MCG PO TABS Oral Take 75 mcg by mouth daily.    . COLCHICINE 0.6 MG PO TABS  2 tabs po x 1, then one tab po 1 hour later 6 tablet 0  . DICLOFENAC SODIUM 1 % TD GEL Topical Apply 1 application topically 4 (four) times daily. 100 g 0  . PREDNISONE 20 MG PO TABS  2 tabs po once daily x 3 days 6 tablet 0    BP 146/71  Pulse 74  Temp 98.5 F (36.9 C) (Oral)  Resp 18  SpO2 98%  Physical Exam  Nursing note and vitals reviewed. Constitutional: She is oriented to person, place, and time. She appears well-developed and well-nourished. No distress.  HENT:  Head: Normocephalic and atraumatic.  Eyes: Conjunctivae and EOM are normal.  Neck: Normal range of motion.  Cardiovascular: Normal rate.   Pulmonary/Chest: Effort normal.  Abdominal: She exhibits no distension.  Musculoskeletal: Normal range of motion.       Hands:      Pitted nails consistent with RA. Erythema, tenderness, swelling at DIP of right index finger. No tenderness along the flexor sheath. Joint stable on  varus/valgus stress. No evidence of paronychia. Hand with intact motor strength 5/5 flexion / extension / FDP / FDS  against resistance and 2-point discrimination intact at 5mm in affected digit. Similar erythema and swelling at DIP left little finger.   motor and sensation in median/radial/ulnar nerve distribution intact in right hand. CR < 2 sec. Skin intact. No signs of trauma. Wrist WNL.   Neurological: She is alert and oriented to person, place, and time. Coordination normal.  Skin: Skin is warm and dry.  Psychiatric: She has a normal mood and affect. Her behavior is normal. Judgment and thought content normal.    ED Course  Procedures (including critical care time)  Labs Reviewed - No data to  display Dg Finger Index Right  04/19/2012  *RADIOLOGY REPORT*  Clinical Data: Pain, redness and swelling.  RIGHT INDEX FINGER 2+V  Comparison: None.  Findings: There is soft tissue swelling about the distal aspect of the second finger.  Pronounced degenerative changes at the second distal interphalangeal joint.  No acute osseous abnormality.  IMPRESSION:  1.  Focal soft tissue swelling over the distal second finger. 2.  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  Original Report Authenticated By: Reyes Ivan, M.D.     1. Gout attack     MDM   Imaging reviewed by myself.  no fracture. Report per radiologist. Discussed imaging findings with patient and spouse. No evidence of septic joint, no pain to passive ROM, no signs of paronychia. No tenderness over her flexor sheath. Swelling is primarily in the joint, rather than around the nail. H&P is most consistent with gout flare in right index finger, especially as she has similar swelling and pain over the distal aspect of left little finger. Home with colchicine bursts, steroids, topical diclofenac. Discussed signs and symptoms that should prompt a return to the department. She agrees with plan.   Luiz Blare, MD 04/20/12 [phone]

## 2012-04-19 NOTE — ED Notes (Signed)
Pt  Reports  Pain  Swelling  Of  r  Index  Finger        She  Has  Redness  And  Swelling  Below  The  Nail       -      Sent  Here  For  evaul  By her PCP        Pt  Has   History            Of  Arthritis       In  Past

## 2013-03-12 ENCOUNTER — Other Ambulatory Visit: Payer: Self-pay | Admitting: Geriatric Medicine

## 2013-03-12 DIAGNOSIS — Z9012 Acquired absence of left breast and nipple: Secondary | ICD-10-CM

## 2013-03-12 DIAGNOSIS — Z1231 Encounter for screening mammogram for malignant neoplasm of breast: Secondary | ICD-10-CM

## 2013-03-14 ENCOUNTER — Ambulatory Visit: Payer: Medicare Other

## 2013-03-14 ENCOUNTER — Ambulatory Visit
Admission: RE | Admit: 2013-03-14 | Discharge: 2013-03-14 | Disposition: A | Discharge status: Home / Self Care | Payer: Medicare Other | Source: Ambulatory Visit | Attending: Geriatric Medicine | Admitting: Geriatric Medicine

## 2013-03-14 DIAGNOSIS — Z1231 Encounter for screening mammogram for malignant neoplasm of breast: Secondary | ICD-10-CM

## 2013-03-14 DIAGNOSIS — Z9012 Acquired absence of left breast and nipple: Secondary | ICD-10-CM

## 2013-08-07 ENCOUNTER — Emergency Department (HOSPITAL_COMMUNITY): Payer: Medicare Other

## 2013-08-07 ENCOUNTER — Observation Stay (HOSPITAL_COMMUNITY)
Admission: EM | Admit: 2013-08-07 | Discharge: 2013-08-09 | Disposition: A | Discharge status: Skilled Nursing Facility | Payer: Medicare Other | Attending: Internal Medicine | Admitting: Internal Medicine

## 2013-08-07 ENCOUNTER — Inpatient Hospital Stay (HOSPITAL_COMMUNITY): Payer: Medicare Other

## 2013-08-07 ENCOUNTER — Encounter (HOSPITAL_COMMUNITY): Payer: Self-pay | Admitting: Emergency Medicine

## 2013-08-07 DIAGNOSIS — IMO0002 Reserved for concepts with insufficient information to code with codable children: Secondary | ICD-10-CM

## 2013-08-07 DIAGNOSIS — W19XXXA Unspecified fall, initial encounter: Secondary | ICD-10-CM

## 2013-08-07 DIAGNOSIS — S32009A Unspecified fracture of unspecified lumbar vertebra, initial encounter for closed fracture: Secondary | ICD-10-CM | POA: Insufficient documentation

## 2013-08-07 DIAGNOSIS — E43 Unspecified severe protein-calorie malnutrition: Secondary | ICD-10-CM | POA: Insufficient documentation

## 2013-08-07 DIAGNOSIS — Z9181 History of falling: Secondary | ICD-10-CM | POA: Insufficient documentation

## 2013-08-07 DIAGNOSIS — Z515 Encounter for palliative care: Secondary | ICD-10-CM | POA: Insufficient documentation

## 2013-08-07 DIAGNOSIS — R627 Adult failure to thrive: Secondary | ICD-10-CM | POA: Insufficient documentation

## 2013-08-07 DIAGNOSIS — Z79899 Other long term (current) drug therapy: Secondary | ICD-10-CM | POA: Insufficient documentation

## 2013-08-07 DIAGNOSIS — M25559 Pain in unspecified hip: Secondary | ICD-10-CM | POA: Insufficient documentation

## 2013-08-07 DIAGNOSIS — E039 Hypothyroidism, unspecified: Secondary | ICD-10-CM | POA: Insufficient documentation

## 2013-08-07 DIAGNOSIS — R55 Syncope and collapse: Principal | ICD-10-CM | POA: Insufficient documentation

## 2013-08-07 DIAGNOSIS — R531 Weakness: Secondary | ICD-10-CM

## 2013-08-07 DIAGNOSIS — W19XXXS Unspecified fall, sequela: Secondary | ICD-10-CM

## 2013-08-07 DIAGNOSIS — Z853 Personal history of malignant neoplasm of breast: Secondary | ICD-10-CM | POA: Insufficient documentation

## 2013-08-07 DIAGNOSIS — E876 Hypokalemia: Secondary | ICD-10-CM | POA: Insufficient documentation

## 2013-08-07 DIAGNOSIS — M899 Disorder of bone, unspecified: Secondary | ICD-10-CM | POA: Insufficient documentation

## 2013-08-07 DIAGNOSIS — F0390 Unspecified dementia without behavioral disturbance: Secondary | ICD-10-CM | POA: Diagnosis present

## 2013-08-07 DIAGNOSIS — I6789 Other cerebrovascular disease: Secondary | ICD-10-CM | POA: Insufficient documentation

## 2013-08-07 DIAGNOSIS — Y92009 Unspecified place in unspecified non-institutional (private) residence as the place of occurrence of the external cause: Secondary | ICD-10-CM | POA: Insufficient documentation

## 2013-08-07 DIAGNOSIS — S51009A Unspecified open wound of unspecified elbow, initial encounter: Secondary | ICD-10-CM | POA: Insufficient documentation

## 2013-08-07 DIAGNOSIS — R5381 Other malaise: Secondary | ICD-10-CM | POA: Insufficient documentation

## 2013-08-07 DIAGNOSIS — W06XXXA Fall from bed, initial encounter: Secondary | ICD-10-CM | POA: Insufficient documentation

## 2013-08-07 DIAGNOSIS — R32 Unspecified urinary incontinence: Secondary | ICD-10-CM | POA: Insufficient documentation

## 2013-08-07 DIAGNOSIS — Z681 Body mass index (BMI) 19 or less, adult: Secondary | ICD-10-CM | POA: Insufficient documentation

## 2013-08-07 DIAGNOSIS — M549 Dorsalgia, unspecified: Secondary | ICD-10-CM | POA: Diagnosis present

## 2013-08-07 DIAGNOSIS — D72829 Elevated white blood cell count, unspecified: Secondary | ICD-10-CM | POA: Insufficient documentation

## 2013-08-07 DIAGNOSIS — M069 Rheumatoid arthritis, unspecified: Secondary | ICD-10-CM | POA: Insufficient documentation

## 2013-08-07 HISTORY — DX: Other symptoms and signs involving the musculoskeletal system: R29.898

## 2013-08-07 HISTORY — DX: Hypothyroidism, unspecified: E03.9

## 2013-08-07 HISTORY — DX: Unspecified urinary incontinence: R32

## 2013-08-07 HISTORY — DX: Other amnesia: R41.3

## 2013-08-07 LAB — URINALYSIS, ROUTINE W REFLEX MICROSCOPIC
Bilirubin Urine: NEGATIVE
Glucose, UA: NEGATIVE mg/dL
Protein, ur: NEGATIVE mg/dL

## 2013-08-07 LAB — CBC WITH DIFFERENTIAL/PLATELET
Basophils Absolute: 0 10*3/uL (ref 0.0–0.1)
Eosinophils Absolute: 0 10*3/uL (ref 0.0–0.7)
Eosinophils Relative: 0 % (ref 0–5)
HCT: 38.2 % (ref 36.0–46.0)
Lymphocytes Relative: 9 % — ABNORMAL LOW (ref 12–46)
Lymphs Abs: 1.1 10*3/uL (ref 0.7–4.0)
MCH: 31.2 pg (ref 26.0–34.0)
MCV: 93.2 fL (ref 78.0–100.0)
Monocytes Absolute: 1 10*3/uL (ref 0.1–1.0)
RDW: 14 % (ref 11.5–15.5)
WBC: 12.2 10*3/uL — ABNORMAL HIGH (ref 4.0–10.5)

## 2013-08-07 LAB — BASIC METABOLIC PANEL
BUN: 11 mg/dL (ref 6–23)
CO2: 28 mEq/L (ref 19–32)
Chloride: 99 mEq/L (ref 96–112)
Creatinine, Ser: 0.75 mg/dL (ref 0.50–1.10)
Glucose, Bld: 120 mg/dL — ABNORMAL HIGH (ref 70–99)

## 2013-08-07 LAB — URINE MICROSCOPIC-ADD ON

## 2013-08-07 LAB — TROPONIN I
Troponin I: <0.3 ng/mL (ref <0.30)
Troponin I: <0.3 ng/mL (ref <0.30)
Troponin I: <0.3 ng/mL (ref <0.30)

## 2013-08-07 LAB — TSH: TSH: 11.272 u[IU]/mL — ABNORMAL HIGH (ref 0.350–4.500)

## 2013-08-07 MED ORDER — ALUM & MAG HYDROXIDE-SIMETH 200-200-20 MG/5ML PO SUSP: 30.0000 mL | Freq: Four times a day (QID) | ORAL | Status: DC | PRN

## 2013-08-07 MED ORDER — DONEPEZIL HCL 10 MG PO TABS
10.0000 mg | ORAL_TABLET | Freq: Every day | ORAL | Status: DC
Start: 2013-08-07 — End: 2013-08-09
  Administered 2013-08-07 – 2013-08-08 (×2): 10 mg via ORAL
  Filled 2013-08-07 (×3): qty 1

## 2013-08-07 MED ORDER — SODIUM CHLORIDE 0.9 % IJ SOLN
3.0000 mL | Freq: Two times a day (BID) | INTRAMUSCULAR | Status: DC
  Administered 2013-08-07 – 2013-08-09 (×4): 3 mL via INTRAVENOUS

## 2013-08-07 MED ORDER — LEVOTHYROXINE SODIUM 50 MCG PO TABS
50.0000 ug | ORAL_TABLET | Freq: Every day | ORAL | Status: DC
  Administered 2013-08-07 – 2013-08-09 (×3): 50 ug via ORAL
  Filled 2013-08-07 (×4): qty 1

## 2013-08-07 MED ORDER — ENOXAPARIN SODIUM 40 MG/0.4ML ~~LOC~~ SOLN
40.0000 mg | SUBCUTANEOUS | Status: DC
  Administered 2013-08-07: 11:00:00 40 mg via SUBCUTANEOUS
  Filled 2013-08-07 (×2): qty 0.4

## 2013-08-07 MED ORDER — ONDANSETRON HCL 4 MG/2ML IJ SOLN: 4.0000 mg | Freq: Four times a day (QID) | INTRAMUSCULAR | Status: DC | PRN

## 2013-08-07 MED ORDER — ONDANSETRON HCL 4 MG PO TABS: 4.0000 mg | ORAL_TABLET | Freq: Four times a day (QID) | ORAL | Status: DC | PRN

## 2013-08-07 MED ORDER — MORPHINE SULFATE 2 MG/ML IJ SOLN: 1.0000 mg | INTRAMUSCULAR | Status: DC | PRN

## 2013-08-07 MED ORDER — SODIUM CHLORIDE 0.9 % IV SOLN
INTRAVENOUS | Status: DC
  Administered 2013-08-07: 1000 mL via INTRAVENOUS
  Administered 2013-08-07 – 2013-08-08 (×2): via INTRAVENOUS

## 2013-08-07 MED ORDER — ACETAMINOPHEN 650 MG RE SUPP: 650.0000 mg | Freq: Four times a day (QID) | RECTAL | Status: DC | PRN

## 2013-08-07 MED ORDER — ACETAMINOPHEN 325 MG PO TABS
650.0000 mg | ORAL_TABLET | Freq: Four times a day (QID) | ORAL | Status: DC | PRN
  Administered 2013-08-08 – 2013-08-09 (×2): 650 mg via ORAL
  Filled 2013-08-07 (×2): qty 2

## 2013-08-07 NOTE — ED Notes (Signed)
MD at bedside. 

## 2013-08-07 NOTE — ED Provider Notes (Signed)
CSN: [id number]     Arrival date & time 08/07/13  0203 History   First MD Initiated Contact with Patient 08/07/13 [phone]     Chief Complaint  Patient presents with  . Fall   (Consider location/radiation/quality/duration/timing/severity/associated sxs/prior Treatment) HPI 79 year old female presents to the emergency apartment from home via EMS with report of multiple falls.  Husband reports patient has had problems with increasing instability, and weakness over the last several weeks to months.  He reports that she barely eats or drinks anything.  He does not feel that he is able to care for her given the multiple falls today.  He reports he came in and around 4:30 this evening and found her laying on the floor at the top of the stairs.  She was incontinent of urine.  She reports she was attempting to get the bathroom, but was not able, and decided to lay down.   After placing her on the commode, he reports that she fell a short time later.  Patient was unconscious when he arrived, but woke up a few minutes later.  EMS came to the house after this fall, and it is reported that they did not find any specific injuries and recommended close followup with her primary care Dr.  Around 12:30 in the morning, patient fell from her bed after trying to get up to use the bathroom.  He reports she's had problems with urinary incontinence over the last 2 months.  Patient has history of prior syncope workup in 2011thougt to be due to dehydration and UTI.  She had return visit 10 days after discharge from that admission with persistent symptoms.  Patient has significant history of rheumatoid arthritis.  She has been off Embrel for the last 4 weeks due to 2 persistent symptoms and worsening.  He feels that she is slightly better off the Embrel.  Patient also has history of dementia, for which she is on Aricept.  Patient sustained 2 skin tears on bilateral elbows with her fall tonight.  Initially she told the nursing staff  she had left hip pain, but also reports severe right pain with exam.  She has been unable walk since her third fall.  Past Medical History  Diagnosis Date  . Arthritis   . Thyroid disease   . Gout   . Rheumatoid arthritis(714.0)   . Cancer     breast CA   Past Surgical History  Procedure Laterality Date  . Breast surgery    . Abdominal hysterectomy     History reviewed. No pertinent family history. History  Substance Use Topics  . Smoking status: Never Smoker   . Smokeless tobacco: Not on file  . Alcohol Use: No   OB History   Grav Para Term Preterm Abortions TAB SAB Ect Mult Living                 Review of Systems  Unable to perform ROS: Dementia    Allergies  Review of patient's allergies indicates no known allergies.  Home Medications  No current outpatient prescriptions on file. BP 110/59  Pulse 89  Temp(Src) 97.5 F (36.4 C) (Oral)  Resp 18  SpO2 95% Physical Exam  Nursing note and vitals reviewed. Constitutional: She is oriented to person, place, and time. She appears well-developed and well-nourished. No distress.  HENT:  Head: Normocephalic and atraumatic.  Nose: Nose normal.  Mouth/Throat: Oropharynx is clear and moist.  Eyes: Conjunctivae and EOM are normal. Pupils are equal, round, and  reactive to light.  Neck: Normal range of motion. Neck supple. No JVD present. No tracheal deviation present. No thyromegaly present.  Cardiovascular: Normal rate, regular rhythm, normal heart sounds and intact distal pulses.  Exam reveals no gallop and no friction rub.   No murmur heard. Pulmonary/Chest: Effort normal and breath sounds normal. No stridor. No respiratory distress. She has no wheezes. She has no rales. She exhibits no tenderness.  Abdominal: Soft. Bowel sounds are normal. She exhibits no distension and no mass. There is no tenderness. There is no rebound and no guarding.  Musculoskeletal: Normal range of motion. She exhibits tenderness (Tenderness with  range of motion of both lower extremities at the hip, right greater than left). She exhibits no edema.  Skin tear to right elbow, left elbow  Lymphadenopathy:    She has no cervical adenopathy.  Neurological: She is alert and oriented to person, place, and time. She exhibits normal muscle tone. Coordination normal.  Skin: Skin is warm and dry. No rash noted. No erythema. No pallor.  Psychiatric: She has a normal mood and affect. Her behavior is normal. Judgment and thought content normal.    ED Course  Procedures (including critical care time) Labs Review Labs Reviewed  URINALYSIS, ROUTINE W REFLEX MICROSCOPIC - Abnormal; Notable for the following:    Hgb urine dipstick MODERATE (*)    Ketones, ur 40 (*)    All other components within normal limits  CBC WITH DIFFERENTIAL - Abnormal; Notable for the following:    WBC 12.2 (*)    Neutrophils Relative % 83 (*)    Neutro Abs 10.1 (*)    Lymphocytes Relative 9 (*)    All other components within normal limits  BASIC METABOLIC PANEL - Abnormal; Notable for the following:    Potassium 3.0 (*)    Glucose, Bld 120 (*)    GFR calc non Af Amer 76 (*)    GFR calc Af Amer 88 (*)    All other components within normal limits  URINE MICROSCOPIC-ADD ON - Abnormal; Notable for the following:    Casts HYALINE CASTS (*)    All other components within normal limits  TROPONIN I   Imaging Review Dg Hip Complete Right  08/07/2013   CLINICAL DATA:  Fall with hip pain.  EXAM: RIGHT HIP - COMPLETE 2+ VIEW  COMPARISON:  None.  FINDINGS: No evidence of acute fracture or dislocation. There is osteopenia and extensive small bowel gas, which decreases the sensitivity of radiography.  IMPRESSION: 1.  No evidence of acute osseous injury. 2. Osteopenia.   Electronically Signed   By: Tiburcio Pea M.D.   On: 08/07/2013 04:09   Ct Head Wo Contrast  08/07/2013   CLINICAL DATA:  Fall.  EXAM: CT HEAD WITHOUT CONTRAST  CT CERVICAL SPINE WITHOUT CONTRAST  TECHNIQUE:  Multidetector CT imaging of the head and cervical spine was performed following the standard protocol without intravenous contrast. Multiplanar CT image reconstructions of the cervical spine were also generated.  COMPARISON:  11/06/2010  FINDINGS: CT HEAD FINDINGS  Skull and Sinuses:No significant abnormality.  Orbits: Bilateral cataract resection.  Brain: No evidence of acute abnormality, such as acute infarction, hemorrhage, hydrocephalus, or mass lesion/mass effect. Unchanged pattern of confluent bilateral cerebral white matter low density, consistent with chronic small vessel ischemia. Cerebral volume loss, age appropriate.  CT CERVICAL SPINE FINDINGS  No evidence of acute fracture or traumatic subluxation. No prevertebral edema or gross cervical canal hematoma. Degenerative disc and facet disease which  is mild for age. No significant osseous canal or foraminal stenosis.  IMPRESSION: 1. No evidence of acute cervical or intracranial injury. 2. Chronic small vessel ischemia.   Electronically Signed   By: Tiburcio Pea M.D.   On: 08/07/2013 04:05   Ct Cervical Spine Wo Contrast  08/07/2013   CLINICAL DATA:  Fall.  EXAM: CT HEAD WITHOUT CONTRAST  CT CERVICAL SPINE WITHOUT CONTRAST  TECHNIQUE: Multidetector CT imaging of the head and cervical spine was performed following the standard protocol without intravenous contrast. Multiplanar CT image reconstructions of the cervical spine were also generated.  COMPARISON:  11/06/2010  FINDINGS: CT HEAD FINDINGS  Skull and Sinuses:No significant abnormality.  Orbits: Bilateral cataract resection.  Brain: No evidence of acute abnormality, such as acute infarction, hemorrhage, hydrocephalus, or mass lesion/mass effect. Unchanged pattern of confluent bilateral cerebral white matter low density, consistent with chronic small vessel ischemia. Cerebral volume loss, age appropriate.  CT CERVICAL SPINE FINDINGS  No evidence of acute fracture or traumatic subluxation. No  prevertebral edema or gross cervical canal hematoma. Degenerative disc and facet disease which is mild for age. No significant osseous canal or foraminal stenosis.  IMPRESSION: 1. No evidence of acute cervical or intracranial injury. 2. Chronic small vessel ischemia.   Electronically Signed   By: Tiburcio Pea M.D.   On: 08/07/2013 04:05    EKG Interpretation     Ventricular Rate:  88 PR Interval:  142 QRS Duration: 75 QT Interval:  408 QTC Calculation: 494 R Axis:   79 Text Interpretation:  Sinus rhythm Probable left atrial enlargement Probable left ventricular hypertrophy Nonspecific T abnrm, anterolateral leads Borderline prolonged QT interval            MDM   1. Syncope   2. Fall at home, initial encounter   3. Urinary incontinence   4. Failure to thrive    79 year old female with multiple falls today.  One episode might have been a syncopal event.  Overall, she presents a failure to thrive picture.  We'll check labs, urine.  She may have urinary tract infection contributing to her symptoms.  We'll check EKG, normal static.  Patient's husband feels unable to care for her at home.  Expect admission and/or transfer to a nursing facility    Olivia Mackie, MD 08/07/13 [phone]

## 2013-08-07 NOTE — Progress Notes (Signed)
Physical Therapy Evaluation Patient Details Name: SAANYA ZIESKE MRN: [id number] DOB: 1927-07-11 Today's Date: 08/07/2013 Time: 5621-3086 PT Time Calculation (min): 25 min  PT Assessment / Plan / Recommendation History of Present Illness  Pt admit with falls, FTT and dementia.  Clinical Impression  Pt admitted with above. Pt currently with functional limitations due to the deficits listed below (see PT Problem List). Pt will need NHP with therapy as pt is very deconditioned.   Pt will benefit from skilled PT to increase their independence and safety with mobility to allow discharge to the venue listed below.      PT Assessment  Patient needs continued PT services    Follow Up Recommendations  SNF;Supervision/Assistance - 24 hour         Barriers to Discharge Decreased caregiver support family wants rehab    Equipment Recommendations  None recommended by PT         Frequency Min 3X/week    Precautions / Restrictions Precautions Precautions: Fall Restrictions Weight Bearing Restrictions: No   Pertinent Vitals/Pain VSS, left hip pain per pt      Mobility  Bed Mobility Bed Mobility: Rolling Right;Right Sidelying to Sit;Sitting - Scoot to Edge of Bed Rolling Right: 3: Mod assist;With rail Right Sidelying to Sit: 3: Mod assist;HOB elevated;With rails Sitting - Scoot to Edge of Bed: 3: Mod assist Details for Bed Mobility Assistance: Needed assist to bring LEs off bed as well as elevate trunk.  Also used pad to scoot pt to EOB.   Transfers Transfers: Sit to Stand;Stand to Sit;Stand Pivot Transfers Sit to Stand: 3: Mod assist;With upper extremity assist;From bed Stand to Sit: 3: Mod assist;With upper extremity assist;With armrests;To chair/3-in-1 Stand Pivot Transfers: 3: Mod assist Details for Transfer Assistance: Pt stood grabbing for PT's UEs as she was unsteady.  Pt needing assist to weight shift bil directions as she c/o left hip pain and pt not shifting weight fully  to left LE.  Pt with flexed posture and posterior pelvic tilt as well.  Pt needed mod assist for stabillity.   Ambulation/Gait Ambulation/Gait Assistance: Not tested (comment) Stairs: No Wheelchair Mobility Wheelchair Mobility: No         PT Diagnosis: Acute pain;Generalized weakness  PT Problem List: Decreased activity tolerance;Decreased balance;Decreased mobility;Decreased knowledge of use of DME;Decreased safety awareness;Decreased knowledge of precautions;Pain PT Treatment Interventions: Gait training;DME instruction;Functional mobility training;Therapeutic activities;Therapeutic exercise;Balance training;Patient/family education     PT Goals(Current goals can be found in the care plan section) Acute Rehab PT Goals Patient Stated Goal: to go home PT Goal Formulation: With patient Time For Goal Achievement: 08/21/13 Potential to Achieve Goals: Good  Visit Information  Last PT Received On: 08/07/13 Assistance Needed: +2 (for ambulation) History of Present Illness: Pt admit with falls, FTT and dementia.       Prior Functioning  Home Living Family/patient expects to be discharged to:: Private residence Living Arrangements: Spouse/significant other Available Help at Discharge: Family;Available 24 hours/day Type of Home: House Home Access: Stairs to enter [employer] of Steps: 4 Entrance Stairs-Rails: Can reach both Home Layout: Two level;1/2 bath on main level;Able to live on main level with bedroom/bathroom Alternate Level Stairs-Number of Steps: 12 Alternate Level Stairs-Rails: Left Home Equipment: Wheelchair - Economist - 4 wheels Prior Function Level of Independence: Independent Communication Communication: No difficulties Dominant Hand: Right    Cognition  Cognition Arousal/Alertness: Awake/alert Behavior During Therapy: WFL for tasks assessed/performed Overall Cognitive Status: History of cognitive impairments - at baseline  Extremity/Trunk Assessment Upper Extremity Assessment Upper Extremity Assessment: Defer to OT evaluation Lower Extremity Assessment Lower Extremity Assessment: RLE deficits/detail;LLE deficits/detail RLE: Unable to fully assess due to pain LLE Deficits / Details: grossly 3-/5 Cervical / Trunk Assessment Cervical / Trunk Assessment: Kyphotic   Balance Balance Balance Assessed: Yes Static Sitting Balance Static Sitting - Balance Support: Bilateral upper extremity supported;Feet supported Static Sitting - Level of Assistance: 5: Stand by assistance Static Sitting - Comment/# of Minutes: 2 Static Standing Balance Static Standing - Balance Support: Bilateral upper extremity supported;During functional activity Static Standing - Level of Assistance: 3: Mod assist Static Standing - Comment/# of Minutes: 2 min with bil UE support.    End of Session PT - End of Session Equipment Utilized During Treatment: Gait belt Activity Tolerance: Patient limited by fatigue Patient left: in chair;with call bell/phone within reach;with chair alarm set Nurse Communication: Mobility status       INGOLD,Aadarsh Cozort 08/07/2013, 3:48 PM [facility] [phone] (pager)

## 2013-08-07 NOTE — H&P (Signed)
[facility] History and Physical  [name] WGN:[id number] DOB: April 17, 1927 DOA: 08/07/2013  Referring physician:  PCP: [name], MD  Specialists:   Chief Complaint: Fall  HPI: [name] is a 79 y.o. female with past medical history of rheumatoid arthritis, thyroid disease and history of breast cancer. Patient also has dementia and urinary incontinence. Patient to the hospital because of falls, she fell yesterday about 4:30, her husband said he found her on the stairs and he did not know if she fell or not, then she fell twice since then. She was briefly unresponsive for the third time, she fell from the bed. In the ED x-rays and CT scans did not show any evidence of fractures, lab work is unremarkable except of mild hypokalemia of 3.0, slight leukocytosis of 12.2.  Review of Systems:  Constitutional: negative for anorexia, fevers and sweats Eyes: negative for irritation, redness and visual disturbance Ears, nose, mouth, throat, and face: negative for earaches, epistaxis, nasal congestion and sore throat Respiratory: negative for cough, dyspnea on exertion, sputum and wheezing Cardiovascular: negative for chest pain, dyspnea, lower extremity edema, orthopnea, palpitations and syncope Gastrointestinal: negative for abdominal pain, constipation, diarrhea, melena, nausea and vomiting Genitourinary:negative for dysuria, frequency and hematuria Hematologic/lymphatic: negative for bleeding, easy bruising and lymphadenopathy Musculoskeletal:negative for arthralgias, muscle weakness and stiff joints Neurological: negative for coordination problems, gait problems, headaches and weakness Endocrine: negative for diabetic symptoms including polydipsia, polyuria and weight loss Allergic/Immunologic: negative for anaphylaxis, hay fever and urticaria  Past Medical History  Diagnosis Date  . Arthritis   . Thyroid disease   . Gout   . Rheumatoid arthritis(714.0)   .  Cancer     breast CA  . Hypothyroidism   . Short-term memory loss   . Incontinence of urine   . Weakness of both legs    Past Surgical History  Procedure Laterality Date  . Abdominal hysterectomy    . Breast surgery  1974    mastectomy left  . Mastectomy  1974    left breast   Social History:  reports that she has never smoked. She has never used smokeless tobacco. She reports that she does not drink alcohol or use illicit drugs.  No Known Allergies  History reviewed. No pertinent family history.   Prior to Admission medications   Medication Sig Start Date End Date Taking? Authorizing Provider  donepezil (ARICEPT) 10 MG tablet Take 10 mg by mouth at bedtime.   Yes Historical Provider, MD  levothyroxine (SYNTHROID, LEVOTHROID) 50 MCG tablet Take 50 mcg by mouth daily before breakfast.   Yes Historical Provider, MD   Physical Exam: Filed Vitals:   08/07/13 0904  BP: 107/57  Pulse: 91  Temp: 98.6 F (37 C)  Resp: 20   General appearance: alert, cooperative and no distress  Head: Normocephalic, without obvious abnormality, atraumatic  Eyes: conjunctivae/corneas clear. PERRL, EOM's intact. Fundi benign.  Nose: Nares normal. Septum midline. Mucosa normal. No drainage or sinus tenderness.  Throat: lips, mucosa, and tongue normal; teeth and gums normal  Neck: Supple, no masses, no cervical lymphadenopathy, no JVD appreciated, no meningeal signs Resp: clear to auscultation bilaterally  Chest wall: no tenderness  Cardio: regular rate and rhythm, S1, S2 normal, no murmur, click, rub or gallop  GI: soft, non-tender; bowel sounds normal; no masses, no organomegaly  Extremities: extremities normal, atraumatic, no cyanosis or edema  Skin: Skin color, texture, turgor normal. No rashes or lesions  Neurologic: Alert and oriented X 3,  normal strength and tone. Normal symmetric reflexes.   Labs on Admission:  Basic Metabolic Panel:  Recent Labs Lab 08/07/13 0330  NA 138  K 3.0*   CL 99  CO2 28  GLUCOSE 120*  BUN 11  CREATININE 0.75  CALCIUM 8.8   Liver Function Tests: No results found for this basename: AST, ALT, ALKPHOS, BILITOT, PROT, ALBUMIN,  in the last 168 hours No results found for this basename: LIPASE, AMYLASE,  in the last 168 hours No results found for this basename: AMMONIA,  in the last 168 hours CBC:  Recent Labs Lab 08/07/13 0330  WBC 12.2*  NEUTROABS 10.1*  HGB 12.8  HCT 38.2  MCV 93.2  PLT 305   Cardiac Enzymes:  Recent Labs Lab 08/07/13 0355 08/07/13 0816  TROPONINI <0.30 <0.30    BNP (last 3 results) No results found for this basename: PROBNP,  in the last 8760 hours CBG: No results found for this basename: GLUCAP,  in the last 168 hours  Radiological Exams on Admission: Dg Hip Complete Right  08/07/2013   CLINICAL DATA:  Fall with hip pain.  EXAM: RIGHT HIP - COMPLETE 2+ VIEW  COMPARISON:  None.  FINDINGS: No evidence of acute fracture or dislocation. There is osteopenia and extensive small bowel gas, which decreases the sensitivity of radiography.  IMPRESSION: 1.  No evidence of acute osseous injury. 2. Osteopenia.   Electronically Signed   By: Tiburcio Pea M.D.   On: 08/07/2013 04:09   Ct Head Wo Contrast  08/07/2013   CLINICAL DATA:  Fall.  EXAM: CT HEAD WITHOUT CONTRAST  CT CERVICAL SPINE WITHOUT CONTRAST  TECHNIQUE: Multidetector CT imaging of the head and cervical spine was performed following the standard protocol without intravenous contrast. Multiplanar CT image reconstructions of the cervical spine were also generated.  COMPARISON:  11/06/2010  FINDINGS: CT HEAD FINDINGS  Skull and Sinuses:No significant abnormality.  Orbits: Bilateral cataract resection.  Brain: No evidence of acute abnormality, such as acute infarction, hemorrhage, hydrocephalus, or mass lesion/mass effect. Unchanged pattern of confluent bilateral cerebral white matter low density, consistent with chronic small vessel ischemia. Cerebral volume  loss, age appropriate.  CT CERVICAL SPINE FINDINGS  No evidence of acute fracture or traumatic subluxation. No prevertebral edema or gross cervical canal hematoma. Degenerative disc and facet disease which is mild for age. No significant osseous canal or foraminal stenosis.  IMPRESSION: 1. No evidence of acute cervical or intracranial injury. 2. Chronic small vessel ischemia.   Electronically Signed   By: Tiburcio Pea M.D.   On: 08/07/2013 04:05   Ct Cervical Spine Wo Contrast  08/07/2013   CLINICAL DATA:  Fall.  EXAM: CT HEAD WITHOUT CONTRAST  CT CERVICAL SPINE WITHOUT CONTRAST  TECHNIQUE: Multidetector CT imaging of the head and cervical spine was performed following the standard protocol without intravenous contrast. Multiplanar CT image reconstructions of the cervical spine were also generated.  COMPARISON:  11/06/2010  FINDINGS: CT HEAD FINDINGS  Skull and Sinuses:No significant abnormality.  Orbits: Bilateral cataract resection.  Brain: No evidence of acute abnormality, such as acute infarction, hemorrhage, hydrocephalus, or mass lesion/mass effect. Unchanged pattern of confluent bilateral cerebral white matter low density, consistent with chronic small vessel ischemia. Cerebral volume loss, age appropriate.  CT CERVICAL SPINE FINDINGS  No evidence of acute fracture or traumatic subluxation. No prevertebral edema or gross cervical canal hematoma. Degenerative disc and facet disease which is mild for age. No significant osseous canal or foraminal stenosis.  IMPRESSION: 1.  No evidence of acute cervical or intracranial injury. 2. Chronic small vessel ischemia.   Electronically Signed   By: Tiburcio Pea M.D.   On: 08/07/2013 04:05    EKG: Independently reviewed.   Assessment/Plan Principal Problem:   Syncope Active Problems:   Fall   Dementia   Failure to thrive in adult   Syncope -Likely multifactorial, per husband patient does not eat or drink enough to support her. -She is been losing  weight lately and it is probably because of poor nutrition. -Check 12-lead EKG, troponin to rule out ACS. -CT scan of the head was already done, no lateralization of symptoms so deferred MRI. -2-D echocardiogram to be done.  Fall -Have some hip pain, right hip x-ray showed no fracture. -Obtain pelvic x-rays as she has pain in the back of her hip.  Dementia -Continue home medications.  Failure to thrive -Husband and her son has can about inpatient rehabilitation. -PT/OT to evaluate and treat, ambulate as tolerated. -Registered dietitian to see her for that recommendation.    Code Status: Full code code Family Communication: Plan discussed with the patient in the presence of son and husband at bedside. Disposition Plan: Inpatient  Time spent: 70 minutes  [facility] Pager [phone]  If 7PM-7AM, please contact night-coverage [email or web address] Password [email or web address] 08/07/2013, 11:12 AM

## 2013-08-07 NOTE — ED Notes (Signed)
Per ems- pt fail at home around 7pm last evening and fail again this morning around 12:30am. Pt was getting out of bed and lost balance hitting left leg hip area. Pain greatly increased when pressure applied to left leg. Pt received 50mg  Fentanyl at 1:20am. Small skin tear on Right elbow area from first fall at 7pm. Family states pt hit head on carpet floor from first fall. Ems reports poor intake and Cbg at 121. Husband at bedside

## 2013-08-07 NOTE — Progress Notes (Signed)
Pt A&0., arrived from ED via stretcher.  Family at the bedside.  Introduced to room, call bell at reach and bed alarm on.  Instructed to call for assist.  Verbalized understanding.  Will continue to monitor.  Amanda Pea, Charity fundraiser.

## 2013-08-08 ENCOUNTER — Observation Stay (HOSPITAL_COMMUNITY): Payer: Medicare Other

## 2013-08-08 DIAGNOSIS — R627 Adult failure to thrive: Secondary | ICD-10-CM

## 2013-08-08 DIAGNOSIS — R5381 Other malaise: Secondary | ICD-10-CM

## 2013-08-08 DIAGNOSIS — Z515 Encounter for palliative care: Secondary | ICD-10-CM

## 2013-08-08 DIAGNOSIS — I369 Nonrheumatic tricuspid valve disorder, unspecified: Secondary | ICD-10-CM

## 2013-08-08 DIAGNOSIS — E43 Unspecified severe protein-calorie malnutrition: Secondary | ICD-10-CM

## 2013-08-08 DIAGNOSIS — R55 Syncope and collapse: Principal | ICD-10-CM

## 2013-08-08 DIAGNOSIS — R531 Weakness: Secondary | ICD-10-CM

## 2013-08-08 DIAGNOSIS — F0390 Unspecified dementia without behavioral disturbance: Secondary | ICD-10-CM

## 2013-08-08 LAB — T4, FREE: Free T4: 1.06 ng/dL (ref 0.80–1.80)

## 2013-08-08 MED ORDER — ENOXAPARIN SODIUM 30 MG/0.3ML ~~LOC~~ SOLN
30.0000 mg | SUBCUTANEOUS | Status: DC
  Administered 2013-08-08: 30 mg via SUBCUTANEOUS
  Filled 2013-08-08 (×2): qty 0.3

## 2013-08-08 MED ORDER — LEVOTHYROXINE SODIUM 50 MCG PO TABS: 75.0000 ug | ORAL_TABLET | Freq: Every day | ORAL | Status: AC

## 2013-08-08 NOTE — Discharge Summary (Addendum)
Physician Discharge Summary  [name]:[id number] DOB: May 08, 1927 DOA: 08/07/2013  PCP: Kim Otto, MD  Admit date: 08/07/2013 Discharge date: 08/09/2013  Time spent: 35 minutes  Recommendations for Outpatient Follow-up:  1. Follow up with PCP in 4 weeks. 2. PMT to follow up as an outpatient 3. SNF for rehab  Discharge Diagnoses:  Principal Problem:   Syncope Active Problems:   Fall   Dementia   Failure to thrive in adult   Severe protein-calorie malnutrition   Palliative care encounter   Weakness generalized   Back pain   Discharge Condition: guarded  Diet recommendation: regular  Filed Weights   08/07/13 0700 08/08/13 0433 08/09/13 0500  Weight: 38.7 kg (85 lb 5.1 oz) 38.4 kg (84 lb 10.5 oz) 39.8 kg (87 lb 11.9 oz)    History of present illness:  79 y.o. female with past medical history of rheumatoid arthritis, thyroid disease and history of breast cancer. Patient also has dementia and urinary incontinence. Patient to the hospital because of falls, she fell yesterday about 4:30, her husband said he found her on the stairs and he did not know if she fell or not, then she fell twice since then. She was briefly unresponsive for the third time, she fell from the bed.  In the ED x-rays and CT scans did not show any evidence of fractures, lab work is unremarkable except of mild hypokalemia of 3.0, slight leukocytosis of 12.2.    Hospital Course:  Syncope/Fall:  - negative orthostatics. CT head negative.  - cardiac markers negative x 3.  - TSH midly elevated, increase synthroid. Re- check TSH and Free T4.  - EKG sinus rhythm, mild prolong Qtc.  - Echo 10.31.2014: showed grade 3 diastolic HF  Back pain due to L3 compression fracture:  - L3 compression Fracture.  - per pt pain is controlled.  - cont tylenol.  Hypothyroidism: - Increased synthroid check TSH in 6 weeks.  Dementia  - cont Aricept.   Severe protein malnutrition:  - ensure TID.  -  Family  met with PMT. Pt. has been gradually worsening not eating or drinking with frequent fall. More forget full.  - They are aware that they are progressively declining over the last 2 year worst within the last few months   Procedures:  CT head  Echo  X-ray of hip   X-ray of lumbar spine   Ct head   Ct neck Lumbar x-ray    Consultations:  none  Discharge Exam: Filed Vitals:   08/09/13 0500  BP: 147/76  Pulse: 87  Temp: 97.7 F (36.5 C)  Resp: 18    General: A&O x3 Cardiovascular: RRR Respiratory: good air movement CTA B/L  Discharge Instructions      Discharge Orders   Future Orders Complete By Expires   Diet - low sodium heart healthy  As directed    Increase activity slowly  As directed        Medication List         acetaminophen 325 MG tablet  Commonly known as:  TYLENOL  Take 2 tablets (650 mg total) by mouth every 6 (six) hours as needed.     donepezil 10 MG tablet  Commonly known as:  ARICEPT  Take 10 mg by mouth at bedtime.     levothyroxine 50 MCG tablet  Commonly known as:  SYNTHROID, LEVOTHROID  Take 1.5 tablets (75 mcg total) by mouth daily before breakfast.     traMADol 50 MG tablet  Commonly known as:  ULTRAM  Take 1 tablet (50 mg total) by mouth every 6 (six) hours as needed for pain.       No Known Allergies    The results of significant diagnostics from this hospitalization (including imaging, microbiology, ancillary and laboratory) are listed below for reference.    Significant Diagnostic Studies: Dg Hip Complete Right  08/07/2013   CLINICAL DATA:  Fall with hip pain.  EXAM: RIGHT HIP - COMPLETE 2+ VIEW  COMPARISON:  None.  FINDINGS: No evidence of acute fracture or dislocation. There is osteopenia and extensive small bowel gas, which decreases the sensitivity of radiography.  IMPRESSION: 1.  No evidence of acute osseous injury. 2. Osteopenia.   Electronically Signed   By: [name] M.D.   On: 08/07/2013 04:09    Ct Head Wo Contrast  08/07/2013   CLINICAL DATA:  Fall.  EXAM: CT HEAD WITHOUT CONTRAST  CT CERVICAL SPINE WITHOUT CONTRAST  TECHNIQUE: Multidetector CT imaging of the head and cervical spine was performed following the standard protocol without intravenous contrast. Multiplanar CT image reconstructions of the cervical spine were also generated.  COMPARISON:  11/06/2010  FINDINGS: CT HEAD FINDINGS  Skull and Sinuses:No significant abnormality.  Orbits: Bilateral cataract resection.  Brain: No evidence of acute abnormality, such as acute infarction, hemorrhage, hydrocephalus, or mass lesion/mass effect. Unchanged pattern of confluent bilateral cerebral white matter low density, consistent with chronic small vessel ischemia. Cerebral volume loss, age appropriate.  CT CERVICAL SPINE FINDINGS  No evidence of acute fracture or traumatic subluxation. No prevertebral edema or gross cervical canal hematoma. Degenerative disc and facet disease which is mild for age. No significant osseous canal or foraminal stenosis.  IMPRESSION: 1. No evidence of acute cervical or intracranial injury. 2. Chronic small vessel ischemia.   Electronically Signed   By: [name] M.D.   On: 08/07/2013 04:05   Ct Cervical Spine Wo Contrast  08/07/2013   CLINICAL DATA:  Fall.  EXAM: CT HEAD WITHOUT CONTRAST  CT CERVICAL SPINE WITHOUT CONTRAST  TECHNIQUE: Multidetector CT imaging of the head and cervical spine was performed following the standard protocol without intravenous contrast. Multiplanar CT image reconstructions of the cervical spine were also generated.  COMPARISON:  11/06/2010  FINDINGS: CT HEAD FINDINGS  Skull and Sinuses:No significant abnormality.  Orbits: Bilateral cataract resection.  Brain: No evidence of acute abnormality, such as acute infarction, hemorrhage, hydrocephalus, or mass lesion/mass effect. Unchanged pattern of confluent bilateral cerebral white matter low density, consistent with chronic small vessel  ischemia. Cerebral volume loss, age appropriate.  CT CERVICAL SPINE FINDINGS  No evidence of acute fracture or traumatic subluxation. No prevertebral edema or gross cervical canal hematoma. Degenerative disc and facet disease which is mild for age. No significant osseous canal or foraminal stenosis.  IMPRESSION: 1. No evidence of acute cervical or intracranial injury. 2. Chronic small vessel ischemia.   Electronically Signed   By: [name] M.D.   On: 08/07/2013 04:05   Dg Pelvis Comp Min 3v  08/08/2013   CLINICAL DATA:  Fall from bed with left hip pain  EXAM: JUDET PELVIS - 3+ VIEW  COMPARISON:  None.  FINDINGS: No evidence of pelvic ring or proximal femur fracture. The femoral heads are located. Marked osteopenia. There is extensive bowel gas, which obscures the sacrum.  IMPRESSION: No evidence of pelvic ring or proximal femur fracture. If there is on going left hip pain, dedicated hip radiography suggested.   Electronically Signed  By: [name] M.D.   On: 08/08/2013 00:00    Microbiology: No results found for this or any previous visit (from the past 240 hour(s)).   Labs: Basic Metabolic Panel:  Recent Labs Lab 08/07/13 0330  NA 138  K 3.0*  CL 99  CO2 28  GLUCOSE 120*  BUN 11  CREATININE 0.75  CALCIUM 8.8   Liver Function Tests: No results found for this basename: AST, ALT, ALKPHOS, BILITOT, PROT, ALBUMIN,  in the last 168 hours No results found for this basename: LIPASE, AMYLASE,  in the last 168 hours No results found for this basename: AMMONIA,  in the last 168 hours CBC:  Recent Labs Lab 08/07/13 0330  WBC 12.2*  NEUTROABS 10.1*  HGB 12.8  HCT 38.2  MCV 93.2  PLT 305   Cardiac Enzymes:  Recent Labs Lab 08/07/13 0355 08/07/13 0816 08/07/13 1650 08/07/13 1940  TROPONINI <0.30 <0.30 <0.30 <0.30   BNP: BNP (last 3 results) No results found for this basename: PROBNP,  in the last 8760 hours CBG: No results found for this basename: GLUCAP,  in  the last 168 hours     Signed:  Marinda Elk  [facility] 08/09/2013, 8:17 AM

## 2013-08-08 NOTE — Care Management Note (Signed)
    Page 1 of 2   08/08/2013     3:51:49 PM   CARE MANAGEMENT NOTE 08/08/2013  Patient:  [name], [name]   Account Number:  [id number]  Date Initiated:  08/08/2013  Documentation initiated by:  [facility]  Subjective/Objective Assessment:   79 y.o. female with past medical history of rheumatoid arthritis, thyroid disease and history of breast cancer. in with syncope. // home with spouse     Action/Plan:   IV fluids, nutrition consult// home with Home Health   Anticipated DC Date:  08/08/2013   Anticipated DC Plan:  HOME W HOME HEALTH SERVICES      DC Planning Services  CM consult      [facility] Choice  HOME HEALTH   Choice offered to / List presented to:  C-1 Patient        HH arranged  HH-6 SOCIAL WORKER  HH-1 RN  HH-10 DISEASE MANAGEMENT  HH-2 PT      HH agency  [facility]   Status of service:   Medicare Important Message given?   (If response is "NO", the following Medicare IM given date fields will be blank) Date Medicare IM given:   Date Additional Medicare IM given:    Discharge Disposition:    Per UR Regulation:  Reviewed for med. necessity/level of care/duration of stay  If discussed at Long Length of Stay Meetings, dates discussed:    Comments:  08/08/13 Oletta Cohn, RN, BSN, NCM [phone] Spoke with pt at bedside regarding discharge planning for [facility]. Offered pt list of home health agencies to choose from.  Pt chose [facility] to render services of RN, PT and SW. Kizzie Furnish of [facility] notified.  No DME needs identified at this time.

## 2013-08-08 NOTE — Evaluation (Signed)
Occupational Therapy Evaluation Patient Details Name: [name] MRN: [id number] DOB: 1927/02/17 Today's Date: 08/08/2013 Time: 7829-5621 OT Time Calculation (min): 22 min  OT Assessment / Plan / Recommendation History of present illness Pt admit with falls, FTT and dementia.   Clinical Impression   Pt demos decline in function with ADLs and ADL mobility safety with decreased strength, balance and endurance and would benefit from acute OT services to address impairments to increase level of function ans safety    OT Assessment  Patient needs continued OT Services    Follow Up Recommendations  SNF;Supervision/Assistance - 24 hour    Barriers to Discharge   Uncertain of pt's husband/family can provide current level of care  Equipment Recommendations  Tub/shower bench    Recommendations for Other Services    Frequency  Min 2X/week    Precautions / Restrictions Precautions Precautions: Fall Restrictions Weight Bearing Restrictions: No   Pertinent Vitals/Pain 5/10    ADL  Grooming: Performed;Wash/dry hands;Wash/dry face;Min guard Where Assessed - Grooming: Supported sitting Upper Body Bathing: Simulated;Minimal assistance Lower Body Bathing: Maximal assistance;Simulated Upper Body Dressing: Performed;Minimal assistance Lower Body Dressing: Maximal assistance;+1 Total assistance Toilet Transfer: Simulated;Moderate assistance Toilet Transfer Method: Sit to stand Toileting - Clothing Manipulation and Hygiene: Performed;Maximal assistance Where Assessed - Glass blower/designer Manipulation and Hygiene: Standing Tub/Shower Transfer Method: Not assessed Equipment Used: Gait belt Transfers/Ambulation Related to ADLs: cues for correct hand placment, safety ADL Comments: Pt and husband state that they are using shower chair in tub and are  still required to step over into tub (which is a safety concern). Pt and husband educated on use of tub bench    OT Diagnosis: Generalized  weakness;Acute pain  OT Problem List: Decreased strength;Decreased knowledge of use of DME or AE;Decreased knowledge of precautions;Decreased activity tolerance;Decreased cognition;Pain;Decreased safety awareness;Impaired balance (sitting and/or standing) OT Treatment Interventions: Self-care/ADL training;Therapeutic exercise;Patient/family education;Neuromuscular education;Balance training;Therapeutic activities;DME and/or AE instruction   OT Goals(Current goals can be found in the care plan section) Acute Rehab OT Goals Patient Stated Goal: to go home OT Goal Formulation: With patient/family Time For Goal Achievement: 08/15/13 Potential to Achieve Goals: Good ADL Goals Pt Will Perform Grooming: with min assist;with min guard assist;standing;with caregiver independent in assisting Pt Will Perform Upper Body Bathing: with min guard assist;with supervision;with set-up;with caregiver independent in assisting Pt Will Perform Lower Body Bathing: with mod assist;with caregiver independent in assisting Pt Will Perform Upper Body Dressing: with min guard assist;with supervision;with set-up Pt Will Perform Lower Body Dressing: with max assist;with mod assist;with caregiver independent in assisting Pt Will Transfer to Toilet: with min assist;with min guard assist;bedside commode;ambulating;regular height toilet;grab bars (3 in 1 over toilet) Pt Will Perform Toileting - Clothing Manipulation and hygiene: with mod assist;with min assist;with caregiver independent in assisting  Visit Information  Last OT Received On: 08/08/13 Assistance Needed: +1 History of Present Illness: Pt admit with falls, FTT and dementia.       Prior Functioning     Home Living Family/patient expects to be discharged to:: Private residence Living Arrangements: Spouse/significant other Available Help at Discharge: Family;Available 24 hours/day Type of Home: House Home Access: Stairs to enter [employer] of  Steps: 4 Entrance Stairs-Rails: Can reach both Home Layout: Two level;1/2 bath on main level;Able to live on main level with bedroom/bathroom Alternate Level Stairs-Number of Steps: 12 Home Equipment: Wheelchair - Economist - 4 wheels Prior Function Level of Independence: Independent Communication Communication: No difficulties Dominant Hand: Right  Vision/Perception Vision - History Baseline Vision: Wears glasses only for reading Patient Visual Report: No change from baseline Perception Perception: Within Functional Limits   Cognition  Cognition Arousal/Alertness: Awake/alert Behavior During Therapy: WFL for tasks assessed/performed Overall Cognitive Status: History of cognitive impairments - at baseline    Extremity/Trunk Assessment Upper Extremity Assessment Upper Extremity Assessment: Overall WFL for tasks assessed;Generalized weakness Lower Extremity Assessment Lower Extremity Assessment: Defer to PT evaluation Cervical / Trunk Assessment Cervical / Trunk Assessment: Kyphotic     Mobility Bed Mobility Bed Mobility: Rolling Right;Right Sidelying to Sit;Sitting - Scoot to Edge of Bed Rolling Right: 4: Min assist;With rail Right Sidelying to Sit: 3: Mod assist;HOB elevated;With rails Sitting - Scoot to Edge of Bed: 4: Min guard Details for Bed Mobility Assistance: Needed assist to bring LEs off bed as well as elevate trunk. Pt able to scoot to EOB to get feet onto floor Transfers Transfers: Sit to Stand;Stand to Sit Sit to Stand: 3: Mod assist;With upper extremity assist;From bed Stand to Sit: With upper extremity assist;With armrests;To chair/3-in-1;4: Min assist Details for Transfer Assistance: cues for safety, correct hand placement     Exercise     Balance Balance Balance Assessed: Yes Dynamic Sitting Balance Dynamic Sitting - Balance Support: No upper extremity supported;Feet unsupported;During functional activity Dynamic Sitting -  Level of Assistance: Other (comment);4: Min assist (min guard A) Static Standing Balance Static Standing - Balance Support: Bilateral upper extremity supported;During functional activity Static Standing - Level of Assistance: 3: Mod assist   End of Session OT - End of Session Equipment Utilized During Treatment: Gait belt Activity Tolerance: Patient tolerated treatment well Patient left: in chair;with call bell/phone within reach;with family/visitor present;with nursing/sitter in room  GO Functional Limitation: Self care Self Care Current Status (Z6109): At least 60 percent but less than 80 percent impaired, limited or restricted Self Care Goal Status (U0454): At least 40 percent but less than 60 percent impaired, limited or restricted   Galen Manila 08/08/2013, 12:57 PM

## 2013-08-08 NOTE — Progress Notes (Signed)
Pt in pain , Complaints of pain in LT hip and back. Prn tylenol given. Orthostat doc SBP 129 lying, 119 set, 133 stand. Pt stated feeling dizzy with change in position  . Pt  Only has tylenol and morphine for pain, Pt does not want Morphine. May need to add a OXy, ultram or another alternative. Pt family requesting  Xray of pain d/t pt fall and back pain. Will continue to monitor

## 2013-08-08 NOTE — Clinical Social Work Psychosocial (Signed)
     Clinical Social Work Department BRIEF PSYCHOSOCIAL ASSESSMENT 08/08/2013  Patient:  [name], [name]     Account Number:  [id number]     Admit date:  08/07/2013  Clinical Social Worker:  Tiburcio Pea  Date/Time:  08/08/2013 03:51 PM  Referred by:  Physician  Date Referred:  08/08/2013 Referred for  SNF Placement   Other Referral:   Interview type:  Other - See comment Other interview type:   Patient, husband, and son    PSYCHOSOCIAL DATA Living Status:  HUSBAND Admitted from facility:   Level of care:   Primary support name:  [name]   [phone] Primary support relationship to patient:  SPOUSE Degree of support available:   Strong support    CURRENT CONCERNS Current Concerns  Post-Acute Placement   Other Concerns:    SOCIAL WORK ASSESSMENT / PLAN 79 year old female admitted from home where she lives with her husband [name].  Tentative plan was for d/c home but PT recommended SNF rehab and it was determined that her husband can not manage her safely at home at this time. CSW discussed dc options including HH, private duty and SNF. Patient was admitted about 3 years ago to [facility] and husband requested that patient be evaluated for CIR. CSW spoke to Roderic Palau, RN- CIR- patient would not qualify due to her admitting diagnosis  and insurance would not cover these in CIR.  Bed search process discussed and initaited.  Husband and son spoke to Thomes Dinning, Palliative Care. Fl2 on chart- plan tentative d/c to SNF tomorrow per MD. Clovis Cao on chart   Assessment/plan status:  Psychosocial Support/Ongoing Assessment of Needs Other assessment/ plan:   Information/referral to community resources:   SNF bed list provided to family    PATIENTS/FAMILYS RESPONSE TO PLAN OF CARE: Pateint is alert but has some early dementia. She is very pleasant. She is willing to accept SNF if needed and defers to her husband's judgement. Bed offers given and they accepted [facility]. Patient will be ok for d/c there tomorrow is Ford per MD. DC summary sent to facility. Notified Elizabetth at [employer].  [name]. [name], LCSWA  [phone]

## 2013-08-08 NOTE — Progress Notes (Signed)
Late entry for 08/07/13  08/08/13 1300  PT G-Codes **NOT FOR INPATIENT CLASS**  Functional Assessment Tool Used clinical judgment  Functional Limitation Mobility: Walking and moving around  Mobility: Walking and Moving Around Current Status [phone]) CK  Mobility: Walking and Moving Around Goal Status [phone]) CI  [facility] Acute Rehabilitation [phone] (pager)

## 2013-08-08 NOTE — Consult Note (Signed)
Patient LO:VFIEPP [name]      DOB: 06/27/1927      IRJ:[id number]     Consult Note from the Palliative Medicine Team at [facility]    Consult Requested by: dr Radonna Ricker     PCP: [name], MD Reason for Consultation:Clarification of GOC and options     Phone Number:[phone]  Assessment of patients Current state: [name] is a 79 y.o. female with past medical history of rheumatoid arthritis, thyroid disease and history of breast cancer. Patient also has dementia and urinary incontinence. Patient to the hospital because of falls, she fell yesterday about 4:30, her husband said he found her on the stairs and he did not know if she fell or not, then she fell twice since then. She was briefly unresponsive for the third time, she fell from the bed.  In the ED x-rays and CT scans did not show any evidence of fractures, lab work is unremarkable except of mild hypokalemia of 3.0, slight leukocytosis of 12.2.   Todays conversation assisting family with anticipatory care needs and decsions   This NP Lorinda Creed reviewed medical records, received report from team, assessed the patient and then meet at the patient's bedside along with her husband and son,  to discuss diagnosis prognosis, GOC, disposition and options.   A detailed discussion was had today regarding advanced directives.  Concepts specific to code status, artifical feeding and hydration, continued IV antibiotics and rehospitalization was had.  The difference between a aggressive medical intervention path  and a palliative comfort care path for this patient at this time was had.  Values and goals of care important to patient and family were attempted to be elicited.  Concept of Hospice and Palliative Care were discussed  The Natural trajectory of dementia  was discussed. Information handout on dementia given.  Questions and concerns addressed.  Hard Choices booklet left for review. Family encouraged to call with questions  or concerns.  PMT will continue to support holistically.       Goals of Care: 1.  Code Status:Full code   2. Scope of Treatment: 1. At this point in time the patient's husband verbalizes desire for all available and offered medical interventions to prolong life.     4. Disposition:   Hopeful for SNF for rehabilitation    3. Symptom Management:   1.  Weakness:  Dc to SNF for rehabilitation, recommend PCS to follow on dc  4. Psychosocial:  Emotional support offered to family.  They are only recently coming to an understanding of the diagnosis of dementia and its natural trajectory.  Recommended caregiver support.    Patient Documents Completed or Given: Document Given Completed  Advanced Directives Pkt    MOST yes   DNR    Gone from My Sight    Hard Choices yes     Brief HPI [name] is a 79 y.o. female with past medical history of rheumatoid arthritis, thyroid disease and history of breast cancer. Patient also has dementia and urinary incontinence. Patient to the hospital because of falls, she fell yesterday about 4:30, her husband said he found her on the stairs and he did not know if she fell or not, then she fell twice since then. She was briefly unresponsive for the third time, she fell from the bed.  In the ED x-rays and CT scans did not show any evidence of fractures, lab work is unremarkable except of mild hypokalemia of 3.0, slight leukocytosis of  12.2.   Todays conversation assisting family with anticipatory care needs and decsions     ROS: weakness and intermittent back pain    PMH:  Past Medical History  Diagnosis Date  . Arthritis   . Thyroid disease   . Gout   . Rheumatoid arthritis(714.0)   . Cancer     breast CA  . Hypothyroidism   . Short-term memory loss   . Incontinence of urine   . Weakness of both legs      PSH: Past Surgical History  Procedure Laterality Date  . Abdominal hysterectomy    . Breast surgery  1974     mastectomy left  . Mastectomy  1974    left breast   I have reviewed the FH and SH and  If appropriate update it with new information. No Known Allergies Scheduled Meds: . donepezil  10 mg Oral QHS  . enoxaparin (LOVENOX) injection  30 mg Subcutaneous Q24H  . levothyroxine  50 mcg Oral QAC breakfast  . sodium chloride  3 mL Intravenous Q12H   Continuous Infusions: . sodium chloride 75 mL/hr at 08/08/13 0949   PRN Meds:.acetaminophen, acetaminophen, alum & mag hydroxide-simeth, morphine injection, ondansetron (ZOFRAN) IV, ondansetron    BP 133/72  Pulse 91  Temp(Src) 98.8 F (37.1 C) (Oral)  Resp 18  Ht 5\' 2"  (1.575 m)  Wt 38.4 kg (84 lb 10.5 oz)  BMI 15.48 kg/m2  SpO2 97%   PPS:30 %   Intake/Output Summary (Last 24 hours) at 08/08/13 1404 Last data filed at 08/08/13 1300  Gross per 24 hour  Intake 2941.25 ml  Output    300 ml  Net 2641.25 ml    Physical Exam:  General: awake and alert to person and place HEENT:  Mm, no exudate Chest:   CTA CVS: RRR Abdomen: soft NT +BS Ext: LUE edema noted, BLE without edema Neuro: intact Psych: pleasant, cooperative  Labs: CBC    Component Value Date/Time   WBC 12.2* 08/07/2013 0330   WBC 12.7* 09/24/2009 1052   RBC 4.10 08/07/2013 0330   RBC 4.96 09/24/2009 1052   HGB 12.8 08/07/2013 0330   HGB 15.4 09/24/2009 1052   HCT 38.2 08/07/2013 0330   HCT 48.0* 09/24/2009 1052   PLT 305 08/07/2013 0330   PLT 364 09/24/2009 1052   MCV 93.2 08/07/2013 0330   MCV 96.8 09/24/2009 1052   MCH 31.2 08/07/2013 0330   MCH 31.0 09/24/2009 1052   MCHC 33.5 08/07/2013 0330   MCHC 32.1 09/24/2009 1052   RDW 14.0 08/07/2013 0330   RDW 14.1 09/24/2009 1052   LYMPHSABS 1.1 08/07/2013 0330   LYMPHSABS 1.2 09/24/2009 1052   MONOABS 1.0 08/07/2013 0330   MONOABS 0.7 09/24/2009 1052   EOSABS 0.0 08/07/2013 0330   EOSABS 0.0 09/24/2009 1052   BASOSABS 0.0 08/07/2013 0330   BASOSABS 0.0 09/24/2009 1052    BMET    Component  Value Date/Time   NA 138 08/07/2013 0330   K 3.0* 08/07/2013 0330   CL 99 08/07/2013 0330   CO2 28 08/07/2013 0330   GLUCOSE 120* 08/07/2013 0330   BUN 11 08/07/2013 0330   CREATININE 0.75 08/07/2013 0330   CALCIUM 8.8 08/07/2013 0330   GFRNONAA 76* 08/07/2013 0330   GFRAA 88* 08/07/2013 0330    CMP     Component Value Date/Time   NA 138 08/07/2013 0330   K 3.0* 08/07/2013 0330   CL 99 08/07/2013 0330   CO2 28 08/07/2013 0330  GLUCOSE 120* 08/07/2013 0330   BUN 11 08/07/2013 0330   CREATININE 0.75 08/07/2013 0330   CALCIUM 8.8 08/07/2013 0330   PROT 6.1 11/15/2010 0600   ALBUMIN 2.8* 11/15/2010 0600   AST 39* 11/15/2010 0600   ALT 64* 11/15/2010 0600   ALKPHOS 86 11/15/2010 0600   BILITOT 0.6 11/15/2010 0600   GFRNONAA 76* 08/07/2013 0330   GFRAA 88* 08/07/2013 0330      Time In Time Out Total Time Spent with Patient Total Overall Time  1430 1620 100 min 110 min    Greater than 50%  of this time was spent counseling and coordinating care related to the above assessment and plan.  Lorinda Creed NP  Palliative Medicine Team Team Phone # [phone] Pager [phone]

## 2013-08-08 NOTE — Progress Notes (Signed)
INITIAL NUTRITION ASSESSMENT  DOCUMENTATION CODES Per approved criteria  -Severe malnutrition in the context of chronic illness   INTERVENTION: 1.  Supplements; Ensure Complete po BID, each supplement provides 350 kcal and 13 grams of protein. 2.  GOC; PMT has been consulted, modify nutrition recommendations as needed based on GOC.   3.  Nutrition-related medications; consider appetite stimulant based on GOC and pt wishes.   NUTRITION DIAGNOSIS: Malnutrition related to poor intake, refusal to eat as evidenced by fat and muscle wasting.   Monitor:  1.  Food/Beverage; pt meeting >/=90% estimated needs with tolerance. 2.  Wt/wt change; monitor trends  Reason for Assessment: low BMI  79 y.o. female  Admitting Dx: Syncope  ASSESSMENT: Pt admitted with syncope and frequent falls at home.  Family has requested a Palliative consult.  RD met with pt who reports that she has not been eating at home.  Pt states that she never gets hungry and frequently does not eat.  Pt states "I have a problem, I just don't eat.  I know I should do better."  Pt states that her husband has placed reminders around the home for her to eat and drink, but she is not motivated to eat without an appetite.  Pt states that she has been eating a few bites since admission.   She states she occasionally drinks Boost at home.    Pt states she only takes 2 medications at home- her thyroid medication and her memory med. She does not remember if she has ever taken an appetite stimulant.   Nutrition Focused Physical Exam:  Subcutaneous Fat:  Orbital Region: moderate wasting Upper Arm Region: severe wasting Thoracic and Lumbar Region: severe wasting  Muscle:  Temple Region: severe wasting Clavicle Bone Region: severe wasting Clavicle and Acromion Bone Region: severe wasting Scapular Bone Region: severe wasting Dorsal Hand: severe wasting Patellar Region: severe wasting Anterior Thigh Region: severe  wasting Posterior Calf Region: severe wasting  Edema: none present  Pt meets criteria for severe MALNUTRITION in the context of chronic illness as evidenced by subcutaneous fat and muscle wasting.  Height: Ht Readings from Last 1 Encounters:  08/07/13 5\' 2"  (1.575 m)    Weight: Wt Readings from Last 1 Encounters:  08/08/13 84 lb 10.5 oz (38.4 kg)    Ideal Body Weight: 110 lbs  % Ideal Body Weight: 76%  Wt Readings from Last 10 Encounters:  08/08/13 84 lb 10.5 oz (38.4 kg)    Usual Body Weight: 100 lbs per pt  % Usual Body Weight: 84%  BMI:  Body mass index is 15.48 kg/(m^2).  Estimated Nutritional Needs: Kcal: 1100-1200 Protein: 40-50g Fluid: ~1.5 L/day  Skin: skin tear, left elbow  Diet Order: General  EDUCATION NEEDS: -Education needs addressed   Intake/Output Summary (Last 24 hours) at 08/08/13 1220 Last data filed at 08/08/13 0800  Gross per 24 hour  Intake 2263.75 ml  Output    300 ml  Net 1963.75 ml    Last BM: 10/29  Labs:   Recent Labs Lab 08/07/13 0330  NA 138  K 3.0*  CL 99  CO2 28  BUN 11  CREATININE 0.75  CALCIUM 8.8  GLUCOSE 120*    CBG (last 3)  No results found for this basename: GLUCAP,  in the last 72 hours  Scheduled Meds: . donepezil  10 mg Oral QHS  . enoxaparin (LOVENOX) injection  30 mg Subcutaneous Q24H  . levothyroxine  50 mcg Oral QAC breakfast  . sodium  chloride  3 mL Intravenous Q12H    Continuous Infusions: . sodium chloride 75 mL/hr at 08/08/13 4696    Past Medical History  Diagnosis Date  . Arthritis   . Thyroid disease   . Gout   . Rheumatoid arthritis(714.0)   . Cancer     breast CA  . Hypothyroidism   . Short-term memory loss   . Incontinence of urine   . Weakness of both legs     Past Surgical History  Procedure Laterality Date  . Abdominal hysterectomy    . Breast surgery  1974    mastectomy left  . Mastectomy  1974    left breast    Loyce Dys, MS RD LDN Clinical  Inpatient Dietitian Pager: [phone] Weekend/After hours pager: [phone]

## 2013-08-08 NOTE — Progress Notes (Signed)
Thank you for consulting the Palliative Medicine Team at [facility] to meet your patient's and family's needs.   The reason that you asked [place] to see your patient is for goals of care discussion  We have scheduled your patient for a meeting: Today Friday 10/31 @ 2:30 pm   The Surrogate decision maker is: husband Bill h: [phone]  Other family members that need to be present: son Tasia Catchings, will return to hospital this afternoon for meeting after picking up his son from school; patient has second son who is lives out of town  Your patient is able/unable to participate: TBD h/o Dementia  Additional Narrative: patient alert interactive on this visit, memory deficits noted- history of dementia, multiple falls at home, progressive decline, FTT   Valente David, RN 08/08/2013, 12:29 PM Palliative Medicine Team RN Liaison [phone]

## 2013-08-08 NOTE — Progress Notes (Signed)
[facility] PROGRESS NOTE Interim History: y.o. female with past medical history of rheumatoid arthritis, thyroid disease and history of breast cancer. Patient also has dementia and urinary incontinence. Patient to the hospital because of falls, she fell yesterday about 4:30, her husband said he found her on the stairs and he did not know if she fell or not, then she fell twice since then. She was briefly unresponsive for the third time, she fell from the bed  Assessment/Plan: Syncope/Fall: - negative orthostatics. CT head negative. - cardiac markers negative x 3. - TSH midly elevated, increase synthroid. Re- check TSH and Free T4. - EKG sinus rhythm, mild prolong Qtc. - Echo pending.     Dementia - cont Aricept.  Severe protein malnutrition: - ensure TID. - Family will like to meet with PMT. Py has been gradually worsening not eating or drinking with frequent fall. More forget full. - They are aware that they are progressively declining over the last 2 year worst within the last few months.    Code Status: Full code code  Family Communication: Plan discussed with the patient in the presence of son and husband at bedside.  Disposition Plan: Inpatient    Consultants:  PMT  Procedures:  X-ray of hip  X-ray of lumbar spine  Ct head  Ct neck  Antibiotics:  None  HPI/Subjective: No complains except for back pain.  Objective: Filed Vitals:   08/08/13 0433 08/08/13 0953 08/08/13 0958 08/08/13 1001  BP: 146/72 129/69 119/75 133/72  Pulse: 95 87  91  Temp: 98.8 F (37.1 C)     TempSrc: Oral     Resp: 18 18    Height:      Weight: 38.4 kg (84 lb 10.5 oz)     SpO2: 95% 97%      Intake/Output Summary (Last 24 hours) at 08/08/13 1031 Last data filed at 08/08/13 0800  Gross per 24 hour  Intake 2263.75 ml  Output    300 ml  Net 1963.75 ml   Filed Weights   08/07/13 0700 08/08/13 0433  Weight: 38.7 kg (85 lb 5.1 oz) 38.4 kg (84 lb 10.5 oz)     Exam:  General: Alert, awake, oriented x1, in no acute distress. cachectic HEENT: No bruits, no goiter.  Heart: Regular rate and rhythm, without murmurs, rubs, gallops.  Lungs: Good air movement, bilateral air movement.  Abdomen: Soft, nontender, nondistended, positive bowel sounds.  Neuro: Grossly intact, nonfocal.   Data Reviewed: Basic Metabolic Panel:  Recent Labs Lab 08/07/13 0330  NA 138  K 3.0*  CL 99  CO2 28  GLUCOSE 120*  BUN 11  CREATININE 0.75  CALCIUM 8.8   Liver Function Tests: No results found for this basename: AST, ALT, ALKPHOS, BILITOT, PROT, ALBUMIN,  in the last 168 hours No results found for this basename: LIPASE, AMYLASE,  in the last 168 hours No results found for this basename: AMMONIA,  in the last 168 hours CBC:  Recent Labs Lab 08/07/13 0330  WBC 12.2*  NEUTROABS 10.1*  HGB 12.8  HCT 38.2  MCV 93.2  PLT 305   Cardiac Enzymes:  Recent Labs Lab 08/07/13 0355 08/07/13 0816 08/07/13 1650 08/07/13 1940  TROPONINI <0.30 <0.30 <0.30 <0.30   BNP (last 3 results) No results found for this basename: PROBNP,  in the last 8760 hours CBG: No results found for this basename: GLUCAP,  in the last 168 hours  No results found for this or any previous visit (from the  past 240 hour(s)).   Studies: Dg Hip Complete Right  08/07/2013   CLINICAL DATA:  Fall with hip pain.  EXAM: RIGHT HIP - COMPLETE 2+ VIEW  COMPARISON:  None.  FINDINGS: No evidence of acute fracture or dislocation. There is osteopenia and extensive small bowel gas, which decreases the sensitivity of radiography.  IMPRESSION: 1.  No evidence of acute osseous injury. 2. Osteopenia.   Electronically Signed   By: Tiburcio Pea M.D.   On: 08/07/2013 04:09   Ct Head Wo Contrast  08/07/2013   CLINICAL DATA:  Fall.  EXAM: CT HEAD WITHOUT CONTRAST  CT CERVICAL SPINE WITHOUT CONTRAST  TECHNIQUE: Multidetector CT imaging of the head and cervical spine was performed following the  standard protocol without intravenous contrast. Multiplanar CT image reconstructions of the cervical spine were also generated.  COMPARISON:  11/06/2010  FINDINGS: CT HEAD FINDINGS  Skull and Sinuses:No significant abnormality.  Orbits: Bilateral cataract resection.  Brain: No evidence of acute abnormality, such as acute infarction, hemorrhage, hydrocephalus, or mass lesion/mass effect. Unchanged pattern of confluent bilateral cerebral white matter low density, consistent with chronic small vessel ischemia. Cerebral volume loss, age appropriate.  CT CERVICAL SPINE FINDINGS  No evidence of acute fracture or traumatic subluxation. No prevertebral edema or gross cervical canal hematoma. Degenerative disc and facet disease which is mild for age. No significant osseous canal or foraminal stenosis.  IMPRESSION: 1. No evidence of acute cervical or intracranial injury. 2. Chronic small vessel ischemia.   Electronically Signed   By: Tiburcio Pea M.D.   On: 08/07/2013 04:05   Ct Cervical Spine Wo Contrast  08/07/2013   CLINICAL DATA:  Fall.  EXAM: CT HEAD WITHOUT CONTRAST  CT CERVICAL SPINE WITHOUT CONTRAST  TECHNIQUE: Multidetector CT imaging of the head and cervical spine was performed following the standard protocol without intravenous contrast. Multiplanar CT image reconstructions of the cervical spine were also generated.  COMPARISON:  11/06/2010  FINDINGS: CT HEAD FINDINGS  Skull and Sinuses:No significant abnormality.  Orbits: Bilateral cataract resection.  Brain: No evidence of acute abnormality, such as acute infarction, hemorrhage, hydrocephalus, or mass lesion/mass effect. Unchanged pattern of confluent bilateral cerebral white matter low density, consistent with chronic small vessel ischemia. Cerebral volume loss, age appropriate.  CT CERVICAL SPINE FINDINGS  No evidence of acute fracture or traumatic subluxation. No prevertebral edema or gross cervical canal hematoma. Degenerative disc and facet disease  which is mild for age. No significant osseous canal or foraminal stenosis.  IMPRESSION: 1. No evidence of acute cervical or intracranial injury. 2. Chronic small vessel ischemia.   Electronically Signed   By: Tiburcio Pea M.D.   On: 08/07/2013 04:05   Dg Pelvis Comp Min 3v  08/08/2013   CLINICAL DATA:  Fall from bed with left hip pain  EXAM: JUDET PELVIS - 3+ VIEW  COMPARISON:  None.  FINDINGS: No evidence of pelvic ring or proximal femur fracture. The femoral heads are located. Marked osteopenia. There is extensive bowel gas, which obscures the sacrum.  IMPRESSION: No evidence of pelvic ring or proximal femur fracture. If there is on going left hip pain, dedicated hip radiography suggested.   Electronically Signed   By: Tiburcio Pea M.D.   On: 08/08/2013 00:00    Scheduled Meds: . donepezil  10 mg Oral QHS  . enoxaparin (LOVENOX) injection  30 mg Subcutaneous Q24H  . levothyroxine  50 mcg Oral QAC breakfast  . sodium chloride  3 mL Intravenous Q12H   Continuous  Infusions: . sodium chloride 75 mL/hr at 08/08/13 0949     Marinda Elk  [facility] Pager [phone]. If 8PM-8AM, please contact night-coverage at [email or web address], password [facility] 08/08/2013, 10:31 AM  LOS: 1 day

## 2013-08-08 NOTE — Clinical Social Work Placement (Signed)
     Clinical Social Work Department CLINICAL SOCIAL WORK PLACEMENT NOTE 08/08/2013  Patient:  [name], [name]  Account Number:  [id number] Admit date:  08/07/2013  Clinical Social Worker:  Lupita Leash Hermenia Fritcher, Theresia Majors  Date/time:  08/08/2013 05:29 PM  Clinical Social Work is seeking post-discharge placement for this patient at the following level of care:   SKILLED NURSING   (*CSW will update this form in Epic as items are completed)   08/08/2013  Patient/family provided with [facility] Department of Clinical Social Works list of facilities offering this level of care within the geographic area requested by the patient (or if unable, by the patients family).  08/08/2013  Patient/family informed of their freedom to choose among providers that offer the needed level of care, that participate in Medicare, Medicaid or managed care program needed by the patient, have an available bed and are willing to accept the patient.  08/01/2013  Patient/family informed of [facility] ownership interest in [facility], as well as of the fact that they are under no obligation to receive care at this facility.  PASARR submitted to EDS on  PASARR number received from EDS on   FL2 transmitted to all facilities in geographic area requested by pt/family on  08/08/2013 FL2 transmitted to all facilities within larger geographic area on   Patient informed that his/her managed care company has contracts with or will negotiate with  certain facilities, including the following:   [facility]- Medicare Complete     Patient/family informed of bed offers received:  08/08/2013 Patient chooses bed at [facility], [place] Physician recommends and patient chooses bed at    Patient to be transferred to  on   Patient to be transferred to facility by Ambulance  (PTAR  The following physician request were entered in Epic:   Additional Comments:

## 2013-08-08 NOTE — Progress Notes (Signed)
Echocardiogram 2D Echocardiogram has been performed.  [name] 08/08/2013, 2:57 PM

## 2013-08-09 DIAGNOSIS — M549 Dorsalgia, unspecified: Secondary | ICD-10-CM

## 2013-08-09 DIAGNOSIS — Y92009 Unspecified place in unspecified non-institutional (private) residence as the place of occurrence of the external cause: Secondary | ICD-10-CM

## 2013-08-09 DIAGNOSIS — W19XXXA Unspecified fall, initial encounter: Secondary | ICD-10-CM

## 2013-08-09 MED ORDER — ACETAMINOPHEN 325 MG PO TABS: 650.0000 mg | ORAL_TABLET | Freq: Four times a day (QID) | ORAL | Status: DC | PRN

## 2013-08-09 MED ORDER — TRAMADOL HCL 50 MG PO TABS: 50.0000 mg | ORAL_TABLET | Freq: Four times a day (QID) | ORAL | Status: DC | PRN

## 2013-08-09 NOTE — Progress Notes (Signed)
[facility] PROGRESS NOTE Interim History: y.o. female with past medical history of rheumatoid arthritis, thyroid disease and history of breast cancer. Patient also has dementia and urinary incontinence. Patient to the hospital because of falls, she fell yesterday about 4:30, her husband said he found her on the stairs and he did not know if she fell or not, then she fell twice since then. She was briefly unresponsive for the third time, she fell from the bed  Assessment/Plan: Syncope/Fall: - negative orthostatics. CT head negative. - cardiac markers negative x 3. - high TSH and Free T4. - Echo 10.31.2014: showed grade 3 diastolic HF.    Back pain: - L3 compression Fracture. - per pt pain is controlled. - cont tylenol.   Dementia - cont Aricept.  Severe protein malnutrition: - ensure TID. - Family will like to meet with PMT. Py has been gradually worsening not eating or drinking with frequent fall. More forget full. - They are aware that she is progressively declining over the last 2 year worst within the last few months.    Code Status: Full code code  Family Communication: Plan discussed with the patient in the presence of son and husband at bedside.  Disposition Plan: Inpatient    Consultants:  PMT  Procedures:  X-ray of hip  X-ray of lumbar spine  Ct head  Ct neck  Lumbar x-ray  Antibiotics:  None  HPI/Subjective: No complains, back pain controlled.  Objective: Filed Vitals:   08/08/13 0958 08/08/13 1001 08/08/13 2008 08/09/13 0500  BP: 119/75 133/72 121/66 147/76  Pulse:  91 89 87  Temp:   97.9 F (36.6 C) 97.7 F (36.5 C)  TempSrc:   Oral Oral  Resp:   18 18  Height:      Weight:    39.8 kg (87 lb 11.9 oz)  SpO2:   98% 98%    Intake/Output Summary (Last 24 hours) at 08/09/13 0808 Last data filed at 08/09/13 1610  Gross per 24 hour  Intake 1375.5 ml  Output      0 ml  Net 1375.5 ml   Filed Weights   08/07/13 0700 08/08/13 0433  08/09/13 0500  Weight: 38.7 kg (85 lb 5.1 oz) 38.4 kg (84 lb 10.5 oz) 39.8 kg (87 lb 11.9 oz)    Exam:  General: Alert, awake, oriented x1, in no acute distress. cachectic HEENT: No bruits, no goiter.  Heart: Regular rate and rhythm, without murmurs, rubs, gallops.  Lungs: Good air movement, bilateral air movement.  Abdomen: Soft, nontender, nondistended, positive bowel sounds.  Neuro: Grossly intact, nonfocal.   Data Reviewed: Basic Metabolic Panel:  Recent Labs Lab 08/07/13 0330  NA 138  K 3.0*  CL 99  CO2 28  GLUCOSE 120*  BUN 11  CREATININE 0.75  CALCIUM 8.8   Liver Function Tests: No results found for this basename: AST, ALT, ALKPHOS, BILITOT, PROT, ALBUMIN,  in the last 168 hours No results found for this basename: LIPASE, AMYLASE,  in the last 168 hours No results found for this basename: AMMONIA,  in the last 168 hours CBC:  Recent Labs Lab 08/07/13 0330  WBC 12.2*  NEUTROABS 10.1*  HGB 12.8  HCT 38.2  MCV 93.2  PLT 305   Cardiac Enzymes:  Recent Labs Lab 08/07/13 0355 08/07/13 0816 08/07/13 1650 08/07/13 1940  TROPONINI <0.30 <0.30 <0.30 <0.30   BNP (last 3 results) No results found for this basename: PROBNP,  in the last 8760 hours CBG: No results  found for this basename: GLUCAP,  in the last 168 hours  No results found for this or any previous visit (from the past 240 hour(s)).   Studies: Dg Lumbar Spine 2-3 Views  08/08/2013   CLINICAL DATA:  Fall  EXAM: LUMBAR SPINE - 2-3 VIEW  COMPARISON:  None.  FINDINGS: Osteopenia. L3 superior endplate depression fracture of indeterminate age. There is 20% loss of height centrally. Moderate narrowing at L4-5 and L5-S1.  IMPRESSION: L3 compression fracture.  Degenerative change.   Electronically Signed   By: Maryclare Bean M.D.   On: 08/08/2013 14:43   Dg Pelvis Comp Min 3v  08/08/2013   CLINICAL DATA:  Fall from bed with left hip pain  EXAM: JUDET PELVIS - 3+ VIEW  COMPARISON:  None.  FINDINGS: No  evidence of pelvic ring or proximal femur fracture. The femoral heads are located. Marked osteopenia. There is extensive bowel gas, which obscures the sacrum.  IMPRESSION: No evidence of pelvic ring or proximal femur fracture. If there is on going left hip pain, dedicated hip radiography suggested.   Electronically Signed   By: Tiburcio Pea M.D.   On: 08/08/2013 00:00    Scheduled Meds: . donepezil  10 mg Oral QHS  . enoxaparin (LOVENOX) injection  30 mg Subcutaneous Q24H  . levothyroxine  50 mcg Oral QAC breakfast  . sodium chloride  3 mL Intravenous Q12H   Continuous Infusions:     Marinda Elk  [facility] Pager [phone]. If 8PM-8AM, please contact night-coverage at [email or web address], password [facility] 08/09/2013, 8:08 AM  LOS: 2 days

## 2013-08-09 NOTE — Progress Notes (Signed)
Clinical Social Work Department CLINICAL SOCIAL WORK PLACEMENT NOTE 08/09/2013  Patient:  [name], [name]  Account Number:  [id number] Admit date:  08/07/2013  Clinical Social Worker:  Lupita Leash CROWDER, Theresia Majors  Date/time:  08/08/2013 05:29 PM  Clinical Social Work is seeking post-discharge placement for this patient at the following level of care:   SKILLED NURSING   (*CSW will update this form in Epic as items are completed)   08/08/2013  Patient/family provided with [facility] Department of Clinical Social Work's list of facilities offering this level of care within the geographic area requested by the patient (or if unable, by the patient's family).  08/08/2013  Patient/family informed of their freedom to choose among providers that offer the needed level of care, that participate in Medicare, Medicaid or managed care program needed by the patient, have an available bed and are willing to accept the patient.  08/01/2013  Patient/family informed of [facility]' ownership interest in [facility], as well as of the fact that they are under no obligation to receive care at this facility.  PASARR submitted to EDS on  PASARR number received from EDS on   FL2 transmitted to all facilities in geographic area requested by pt/family on  08/08/2013 FL2 transmitted to all facilities within larger geographic area on   Patient informed that his/her managed care company has contracts with or will negotiate with  certain facilities, including the following:   [facility]- Medicare Complete     Patient/family informed of bed offers received:  08/08/2013 Patient chooses bed at [facility], [place] Physician recommends and patient chooses bed at    [facility] [place] on  08/09/2013 Patient to be transferred to facility by Ambulance  ([facility]  The following physician request were entered in Epic:   Additional Comments:

## 2013-08-09 NOTE — Progress Notes (Signed)
Pt O3x no complaints of SOB, complaints of back and lt elbow pain. Pt decline PRN meds for pain. Will continue to monitor

## 2013-08-09 NOTE — Progress Notes (Signed)
Report called to [facility], pt to be d/c today.

## 2013-10-22 ENCOUNTER — Ambulatory Visit: Payer: Medicare Other | Admitting: Physical Therapy

## 2013-10-29 ENCOUNTER — Ambulatory Visit: Payer: Medicare Other | Attending: Geriatric Medicine | Admitting: Physical Therapy

## 2013-10-29 DIAGNOSIS — M810 Age-related osteoporosis without current pathological fracture: Secondary | ICD-10-CM | POA: Insufficient documentation

## 2013-10-29 DIAGNOSIS — I890 Lymphedema, not elsewhere classified: Secondary | ICD-10-CM | POA: Insufficient documentation

## 2013-10-29 DIAGNOSIS — Z853 Personal history of malignant neoplasm of breast: Secondary | ICD-10-CM | POA: Insufficient documentation

## 2013-10-29 DIAGNOSIS — IMO0001 Reserved for inherently not codable concepts without codable children: Secondary | ICD-10-CM | POA: Insufficient documentation

## 2013-10-31 ENCOUNTER — Ambulatory Visit: Payer: Medicare Other | Admitting: Physical Therapy

## 2013-11-03 ENCOUNTER — Ambulatory Visit: Payer: Medicare Other | Admitting: Physical Therapy

## 2013-11-05 ENCOUNTER — Ambulatory Visit: Payer: Medicare Other

## 2013-11-06 ENCOUNTER — Ambulatory Visit: Payer: Medicare Other | Admitting: Physical Therapy

## 2013-11-10 ENCOUNTER — Ambulatory Visit: Payer: Medicare Other | Attending: Geriatric Medicine | Admitting: Physical Therapy

## 2013-11-10 DIAGNOSIS — I890 Lymphedema, not elsewhere classified: Secondary | ICD-10-CM | POA: Insufficient documentation

## 2013-11-10 DIAGNOSIS — M810 Age-related osteoporosis without current pathological fracture: Secondary | ICD-10-CM | POA: Insufficient documentation

## 2013-11-10 DIAGNOSIS — IMO0001 Reserved for inherently not codable concepts without codable children: Secondary | ICD-10-CM | POA: Insufficient documentation

## 2013-11-10 DIAGNOSIS — Z853 Personal history of malignant neoplasm of breast: Secondary | ICD-10-CM | POA: Insufficient documentation

## 2013-11-14 ENCOUNTER — Ambulatory Visit: Payer: Medicare Other | Attending: Geriatric Medicine | Admitting: Physical Therapy

## 2013-11-14 DIAGNOSIS — I890 Lymphedema, not elsewhere classified: Secondary | ICD-10-CM | POA: Insufficient documentation

## 2013-11-14 DIAGNOSIS — IMO0001 Reserved for inherently not codable concepts without codable children: Secondary | ICD-10-CM | POA: Insufficient documentation

## 2013-11-17 ENCOUNTER — Encounter: Payer: Medicare Other | Admitting: Physical Therapy

## 2013-11-19 ENCOUNTER — Encounter: Payer: Medicare Other | Admitting: Physical Therapy

## 2013-11-21 ENCOUNTER — Encounter: Payer: Medicare Other | Admitting: Physical Therapy

## 2014-01-07 ENCOUNTER — Telehealth: Payer: Self-pay | Admitting: Hematology and Oncology

## 2014-01-07 NOTE — Telephone Encounter (Signed)
S/W PATIENT HUSBAND(BILL) AND GAVE NEW PATIENT APPT FOR 04/15 @ 1:30 W/DR. Goessel.  REFERRING DR. HAL STONEKING DX- MONOCLONAL GAMMOPATHY WELCOME PACKET MAILED.

## 2014-01-07 NOTE — Telephone Encounter (Signed)
C/D 01/07/14 for appt. 01/21/14

## 2014-01-21 ENCOUNTER — Ambulatory Visit: Payer: PRIVATE HEALTH INSURANCE

## 2014-01-21 ENCOUNTER — Telehealth: Payer: Self-pay | Admitting: Hematology and Oncology

## 2014-01-21 ENCOUNTER — Ambulatory Visit (HOSPITAL_BASED_OUTPATIENT_CLINIC_OR_DEPARTMENT_OTHER): Payer: 59

## 2014-01-21 ENCOUNTER — Encounter: Payer: Self-pay | Admitting: Hematology and Oncology

## 2014-01-21 ENCOUNTER — Ambulatory Visit (HOSPITAL_BASED_OUTPATIENT_CLINIC_OR_DEPARTMENT_OTHER): Payer: 59 | Admitting: Hematology and Oncology

## 2014-01-21 VITALS — BP 146/73 | HR 77 | Temp 97.6°F | Resp 17 | Ht 62.0 in | Wt 85.7 lb

## 2014-01-21 DIAGNOSIS — Z853 Personal history of malignant neoplasm of breast: Secondary | ICD-10-CM | POA: Insufficient documentation

## 2014-01-21 DIAGNOSIS — D472 Monoclonal gammopathy: Secondary | ICD-10-CM

## 2014-01-21 DIAGNOSIS — R634 Abnormal weight loss: Secondary | ICD-10-CM

## 2014-01-21 LAB — COMPREHENSIVE METABOLIC PANEL (CC13)
ALBUMIN: 3.4 g/dL — AB (ref 3.5–5.0)
ALT: 14 U/L (ref 0–55)
ANION GAP: 8 meq/L (ref 3–11)
AST: 18 U/L (ref 5–34)
Alkaline Phosphatase: 93 U/L (ref 40–150)
BUN: 11.5 mg/dL (ref 7.0–26.0)
CALCIUM: 9.6 mg/dL (ref 8.4–10.4)
CHLORIDE: 103 meq/L (ref 98–109)
CO2: 29 meq/L (ref 22–29)
Creatinine: 0.8 mg/dL (ref 0.6–1.1)
GLUCOSE: 101 mg/dL (ref 70–140)
POTASSIUM: 3.8 meq/L (ref 3.5–5.1)
Sodium: 141 mEq/L (ref 136–145)
Total Bilirubin: 0.31 mg/dL (ref 0.20–1.20)
Total Protein: 7.3 g/dL (ref 6.4–8.3)

## 2014-01-21 LAB — CBC WITH DIFFERENTIAL/PLATELET
BASO%: 0.4 % (ref 0.0–2.0)
Basophils Absolute: 0 10*3/uL (ref 0.0–0.1)
EOS ABS: 0.1 10*3/uL (ref 0.0–0.5)
EOS%: 0.7 % (ref 0.0–7.0)
HCT: 40 % (ref 34.8–46.6)
HGB: 13 g/dL (ref 11.6–15.9)
LYMPH#: 1.5 10*3/uL (ref 0.9–3.3)
LYMPH%: 16.5 % (ref 14.0–49.7)
MCH: 31 pg (ref 25.1–34.0)
MCHC: 32.5 g/dL (ref 31.5–36.0)
MCV: 95.2 fL (ref 79.5–101.0)
MONO#: 0.8 10*3/uL (ref 0.1–0.9)
MONO%: 8.4 % (ref 0.0–14.0)
NEUT%: 74 % (ref 38.4–76.8)
NEUTROS ABS: 6.7 10*3/uL — AB (ref 1.5–6.5)
PLATELETS: 417 10*3/uL — AB (ref 145–400)
RBC: 4.2 10*6/uL (ref 3.70–5.45)
RDW: 13.9 % (ref 11.2–14.5)
WBC: 9.1 10*3/uL (ref 3.9–10.3)

## 2014-01-21 LAB — LACTATE DEHYDROGENASE (CC13): LDH: 169 U/L (ref 125–245)

## 2014-01-21 NOTE — Telephone Encounter (Signed)
gv and printed appt sched and avs for pt for May...gv pt barium   °

## 2014-01-21 NOTE — Progress Notes (Signed)
[place] CONSULT NOTE  Patient Care Team: Lajean Manes, MD as PCP - General (Internal Medicine)  CHIEF COMPLAINTS/PURPOSE OF CONSULTATION:  IgM kappa M spike on background history of left breast cancer  HISTORY OF PRESENTING ILLNESS:  [name] 80 y.o. female is here because of abnormal blood work detected after the patient has bone fracture. The patient herself is a very poor historian with background history of dementia. Majority of the history was obtained through her husband. In terms of her background history of breast cancer, it was initially found after she palpated a lump on the left breast in 1974. She underwent radical left mastectomy with axillary lymph node dissection. She had been treated by some form of chemotherapy, unknown type along with radiation therapy. She never received adjuvant tamoxifen. They have the disease was unknown. Over the past 2 years, she had gradual weight loss due to anorexia and recurrent falls. Last year, she had L3 compression fracture thought to be related to osteoporosis. On 12/30/2013, routine blood test showed she has elevated IgM level of 421 along with free kappa light chain at 77.28 with 0.3 M spike detected on serum protein electrophoresis. CBC done in June 2014 show she was not anemic. Serum chemistries show no evidence of hypercalcemia or kidney dysfunction. She denies history of abnormal bone pain apart from chronic arthritis. Patient denies history of recurrent infection or atypical infections such as shingles of meningitis. Denies chills, night sweats, anorexia  She denies any recent abnormal breast examination, palpable mass, abnormal breast appearance or nipple changes   MEDICAL HISTORY:  Past Medical History  Diagnosis Date  . Arthritis   . Thyroid disease   . Gout   . Rheumatoid arthritis(714.0)   . Cancer     breast CA  . Hypothyroidism   . Short-term memory loss   . Incontinence of urine   . Weakness  of both legs     SURGICAL HISTORY: Past Surgical History  Procedure Laterality Date  . Abdominal hysterectomy    . Breast surgery  1974    mastectomy left  . Mastectomy  1974    left breast  . Lumbar fracture      SOCIAL HISTORY: History   Social History  . Marital Status: Married    Spouse Name: N/A    Number of Children: N/A  . Years of Education: N/A   Occupational History  . Not on file.   Social History Main Topics  . Smoking status: Never Smoker   . Smokeless tobacco: Never Used  . Alcohol Use: No  . Drug Use: No  . Sexual Activity: Not on file   Other Topics Concern  . Not on file   Social History Narrative  . No narrative on file    FAMILY HISTORY: Family History  Problem Relation Age of Onset  . Cancer Sister     unknown cancer    ALLERGIES:  has No Known Allergies.  MEDICATIONS:  Current Outpatient Prescriptions  Medication Sig Dispense Refill  . acetaminophen (TYLENOL) 500 MG tablet Take 500 mg by mouth every 6 (six) hours as needed.      . donepezil (ARICEPT) 10 MG tablet Take 10 mg by mouth at bedtime.      Marland Kitchen levothyroxine (SYNTHROID, LEVOTHROID) 50 MCG tablet Take 1.5 tablets (75 mcg total) by mouth daily before breakfast.       No current facility-administered medications for this visit.    REVIEW OF SYSTEMS:   Eyes:  Denies blurriness of vision, double vision or watery eyes Ears, nose, mouth, throat, and face: Denies mucositis or sore throat Respiratory: Denies cough, dyspnea or wheezes Cardiovascular: Denies palpitation, chest discomfort. She has chronic lymphedema left upper extremity. Gastrointestinal:  Denies nausea, heartburn or change in bowel habits Skin: Denies abnormal skin rashes Lymphatics: Denies new lymphadenopathy or easy bruising Neurological:Denies numbness, tingling. The patient has profound weaknesses but no focal neurological deficit Behavioral/Psych: Mood is stable, no new changes  All other systems were reviewed  with the patient and are negative.  PHYSICAL EXAMINATION: ECOG PERFORMANCE STATUS: 2 - Symptomatic, <50% confined to bed  Filed Vitals:   01/21/14 1400  BP: 146/73  Pulse: 77  Temp: 97.6 F (36.4 C)  Resp: 17   Filed Weights   01/21/14 1400  Weight: 85 lb 11.2 oz (38.873 kg)    GENERAL:alert, no distress and comfortable. She appears very thin and cachectic SKIN: skin color, texture, turgor are normal, no rashes or significant lesions EYES: normal, conjunctiva are pink and non-injected, sclera clear OROPHARYNX:no exudate, no erythema and lips, buccal mucosa, and tongue normal  NECK: supple, thyroid normal size, non-tender, without nodularity LYMPH:  no palpable lymphadenopathy in the cervical, axillary or inguinal LUNGS: clear to auscultation and percussion with normal breathing effort HEART: regular rate & rhythm and no murmurs, with significant left upper extremity edema ABDOMEN:abdomen soft, non-tender and normal bowel sounds Musculoskeletal:no cyanosis of digits and no clubbing  PSYCH: alert & oriented x 3 with fluent speech NEURO: no focal motor/sensory deficits Right breast examination was normal. On the left, well-healed mastectomy scar with no palpable abnormalities on the chest wall LABORATORY DATA:  I have reviewed the data as listed Lab Results  Component Value Date   WBC 9.1 01/21/2014   HGB 13.0 01/21/2014   HCT 40.0 01/21/2014   MCV 95.2 01/21/2014   PLT 417* 01/21/2014   ASSESSMENT:  We discussed the approach for diagnostic testing for IgM kappa MGUS. I am concerned about the weight loss and background history of breast cancer.   PLAN:  #1 MGUS To rule out multiple myeloma, I recommend complete blood work, and skeletal survey to rule out multiple myeloma Depending on test results, we may or may not proceed with bone marrow aspirate and biopsy.  #2 progressive weight loss #3 remote history of left breast cancer I will order a CT scan of the chest, abdomen  and pelvis to exclude metastatic cancer. Sometimes, IgM type of MGUS can cause lymphoplasmacytic lymphoma that could cause weight loss. Orders Placed This Encounter  Procedures  . CT Chest W Contrast    Standing Status: Future     Number of Occurrences:      Standing Expiration Date: 03/23/2015    Order Specific Question:  Reason for Exam (SYMPTOM  OR DIAGNOSIS REQUIRED)    Answer:  weight loss, hx breast ca, excldued metastatic ca    Order Specific Question:  Preferred imaging location?    Answer:  [facility]  . CT Abdomen Pelvis W Contrast    Standing Status: Future     Number of Occurrences:      Standing Expiration Date: 04/23/2015    Order Specific Question:  Reason for Exam (SYMPTOM  OR DIAGNOSIS REQUIRED)    Answer:  weight loss, hx breast ca, excldued metastatic ca    Order Specific Question:  Preferred imaging location?    Answer:  [facility]    Standing Status:  Future     Number of Occurrences:      Standing Expiration Date: 03/23/2015    Order Specific Question:  Reason for Exam (SYMPTOM  OR DIAGNOSIS REQUIRED)    Answer:  staging myeloma, IgMk kappa    Order Specific Question:  Preferred imaging location?    Answer:  [facility]  . CBC with Differential    Standing Status: Future     Number of Occurrences: 1     Standing Expiration Date: 01/21/2015  . Comprehensive metabolic panel    Standing Status: Future     Number of Occurrences: 1     Standing Expiration Date: 01/21/2015  . Lactate dehydrogenase    Standing Status: Future     Number of Occurrences: 1     Standing Expiration Date: 01/21/2015  . SPEP & IFE with QIG    Standing Status: Future     Number of Occurrences: 1     Standing Expiration Date: 01/21/2015  . Kappa/lambda light chains    Standing Status: Future     Number of Occurrences: 1     Standing Expiration Date: 01/21/2015  . Beta 2 microglobulin, serum    Standing Status: Future     Number of  Occurrences: 1     Standing Expiration Date: 01/22/2015    All questions were answered. The patient knows to call the clinic with any problems, questions or concerns. I spent 55 minutes counseling the patient face to face. The total time spent in the appointment was 60 minutes and more than 50% was on counseling.     Heath Lark, MD 01/21/2014 8:24 PM

## 2014-01-21 NOTE — Progress Notes (Signed)
Checked in new pt with no financial concerns. °

## 2014-01-23 LAB — SPEP & IFE WITH QIG
ALBUMIN ELP: 48.9 % — AB (ref 55.8–66.1)
ALPHA-1-GLOBULIN: 5.4 % — AB (ref 2.9–4.9)
ALPHA-2-GLOBULIN: 13.8 % — AB (ref 7.1–11.8)
BETA GLOBULIN: 6.3 % (ref 4.7–7.2)
Beta 2: 11 % — ABNORMAL HIGH (ref 3.2–6.5)
Gamma Globulin: 14.6 % (ref 11.1–18.8)
IGG (IMMUNOGLOBIN G), SERUM: 1170 mg/dL (ref 690–1700)
IgA: 305 mg/dL (ref 69–380)
IgM, Serum: 374 mg/dL — ABNORMAL HIGH (ref 52–322)
M-Spike, %: 0.45 g/dL
TOTAL PROTEIN, SERUM ELECTROPHOR: 7.1 g/dL (ref 6.0–8.3)

## 2014-01-23 LAB — KAPPA/LAMBDA LIGHT CHAINS
KAPPA LAMBDA RATIO: 4.33 — AB (ref 0.26–1.65)
Kappa free light chain: 9.44 mg/dL — ABNORMAL HIGH (ref 0.33–1.94)
LAMBDA FREE LGHT CHN: 2.18 mg/dL (ref 0.57–2.63)

## 2014-01-23 LAB — BETA 2 MICROGLOBULIN, SERUM: BETA 2 MICROGLOBULIN: 3.12 mg/L — AB (ref <=2.51)

## 2014-01-29 ENCOUNTER — Ambulatory Visit (HOSPITAL_COMMUNITY)
Admission: RE | Admit: 2014-01-29 | Discharge: 2014-01-29 | Disposition: A | Discharge status: Home / Self Care | Payer: PRIVATE HEALTH INSURANCE | Source: Ambulatory Visit | Attending: Hematology and Oncology | Admitting: Hematology and Oncology

## 2014-01-29 DIAGNOSIS — C9000 Multiple myeloma not having achieved remission: Secondary | ICD-10-CM | POA: Insufficient documentation

## 2014-01-29 DIAGNOSIS — Z9221 Personal history of antineoplastic chemotherapy: Secondary | ICD-10-CM | POA: Insufficient documentation

## 2014-01-29 DIAGNOSIS — D472 Monoclonal gammopathy: Secondary | ICD-10-CM

## 2014-01-29 DIAGNOSIS — Z923 Personal history of irradiation: Secondary | ICD-10-CM | POA: Insufficient documentation

## 2014-01-29 DIAGNOSIS — N838 Other noninflammatory disorders of ovary, fallopian tube and broad ligament: Secondary | ICD-10-CM | POA: Insufficient documentation

## 2014-01-29 DIAGNOSIS — K5730 Diverticulosis of large intestine without perforation or abscess without bleeding: Secondary | ICD-10-CM | POA: Insufficient documentation

## 2014-01-29 DIAGNOSIS — Z853 Personal history of malignant neoplasm of breast: Secondary | ICD-10-CM | POA: Insufficient documentation

## 2014-01-29 DIAGNOSIS — R634 Abnormal weight loss: Secondary | ICD-10-CM

## 2014-01-29 DIAGNOSIS — M542 Cervicalgia: Secondary | ICD-10-CM | POA: Insufficient documentation

## 2014-01-29 DIAGNOSIS — Z9010 Acquired absence of unspecified breast and nipple: Secondary | ICD-10-CM | POA: Insufficient documentation

## 2014-01-29 DIAGNOSIS — K668 Other specified disorders of peritoneum: Secondary | ICD-10-CM | POA: Insufficient documentation

## 2014-01-29 MED ORDER — IOHEXOL 300 MG/ML  SOLN
80.0000 mL | Freq: Once | INTRAMUSCULAR | Status: AC | PRN
  Administered 2014-01-29: 80 mL via INTRAVENOUS

## 2014-02-06 ENCOUNTER — Telehealth: Payer: Self-pay | Admitting: Hematology and Oncology

## 2014-02-06 ENCOUNTER — Telehealth: Payer: Self-pay | Admitting: Dietician

## 2014-02-06 ENCOUNTER — Ambulatory Visit (HOSPITAL_BASED_OUTPATIENT_CLINIC_OR_DEPARTMENT_OTHER): Payer: PRIVATE HEALTH INSURANCE | Admitting: Hematology and Oncology

## 2014-02-06 VITALS — BP 141/88 | HR 81 | Temp 97.2°F | Resp 18 | Ht 62.0 in | Wt 85.4 lb

## 2014-02-06 DIAGNOSIS — I890 Lymphedema, not elsewhere classified: Secondary | ICD-10-CM

## 2014-02-06 DIAGNOSIS — R634 Abnormal weight loss: Secondary | ICD-10-CM

## 2014-02-06 DIAGNOSIS — D472 Monoclonal gammopathy: Secondary | ICD-10-CM

## 2014-02-06 DIAGNOSIS — R630 Anorexia: Secondary | ICD-10-CM

## 2014-02-06 DIAGNOSIS — Z853 Personal history of malignant neoplasm of breast: Secondary | ICD-10-CM

## 2014-02-06 DIAGNOSIS — R64 Cachexia: Secondary | ICD-10-CM

## 2014-02-06 DIAGNOSIS — F0390 Unspecified dementia without behavioral disturbance: Secondary | ICD-10-CM

## 2014-02-06 DIAGNOSIS — N9489 Other specified conditions associated with female genital organs and menstrual cycle: Secondary | ICD-10-CM

## 2014-02-06 DIAGNOSIS — R32 Unspecified urinary incontinence: Secondary | ICD-10-CM

## 2014-02-06 MED ORDER — MIRTAZAPINE 15 MG PO TABS: 15.0000 mg | ORAL_TABLET | Freq: Every day | ORAL | Status: DC

## 2014-02-06 NOTE — Progress Notes (Signed)
[place] OFFICE PROGRESS NOTE  Patient Care Team: Lajean Manes, MD as PCP - General (Internal Medicine)  DIAGNOSIS: IgM kappa MGUS, remote history of left breast cancer and dementia SUMMARY OF ONCOLOGIC HISTORY: The patient was referred here in April 2014 because of abnormal blood work detected after the patient has bone fracture. The patient herself is a very poor historian with background history of dementia. Majority of the history was obtained through her husband. She has background history of breast cancer, it was initially found after she palpated a lump on the left breast in 1974. She underwent radical left mastectomy with axillary lymph node dissection. She had been treated by some form of chemotherapy, unknown type along with radiation therapy. She never received adjuvant tamoxifen. They have the disease was unknown. Over the past 2 years, she had gradual weight loss due to anorexia and recurrent falls. Last year, she had L3 compression fracture thought to be related to osteoporosis. On 12/30/2013, routine blood test showed she has elevated IgM level of 421 along with free kappa light chain at 77.28 with 0.3 M spike detected on serum protein electrophoresis. CBC done in June 2014 show she was not anemic. Serum chemistries show no evidence of hypercalcemia or kidney dysfunction. In April 2015, CT scan of the chest abdomen and pelvis revealed abnormal adnexal masses. Skeletal survey showed no evidence of lytic lesions. Overall present patient is compatible with IgM kappa MGUS. She has no evidence of recurrence of breast cancer. The cause of anorexia is unknown.  INTERVAL HISTORY: [name] 80 y.o. female returns for further followup. The patient was undergoing physical therapy for lymphedema. Her husband is concerned about new onset of urinary incontinence. There were no signs and symptoms to suggest urinary tract infection.  I have reviewed the past medical  history, past surgical history, social history and family history with the patient and they are unchanged from previous note.  ALLERGIES:  has No Known Allergies.  MEDICATIONS:  Current Outpatient Prescriptions  Medication Sig Dispense Refill  . acetaminophen (TYLENOL) 500 MG tablet Take 500 mg by mouth every 6 (six) hours as needed.      Marland Kitchen alendronate (FOSAMAX) 70 MG tablet Take 70 mg by mouth once a week. Take with a full glass of water on an empty stomach.      . Calcium Carbonate-Vitamin D (CALCIUM + D PO) Take by mouth daily.      Marland Kitchen donepezil (ARICEPT) 10 MG tablet Take 10 mg by mouth at bedtime.      Marland Kitchen levothyroxine (SYNTHROID, LEVOTHROID) 50 MCG tablet Take 1.5 tablets (75 mcg total) by mouth daily before breakfast.      . mirtazapine (REMERON) 15 MG tablet Take 1 tablet (15 mg total) by mouth at bedtime.  30 tablet  6   No current facility-administered medications for this visit.    REVIEW OF SYSTEMS:   All other systems were reviewed with the patient and are negative.  PHYSICAL EXAMINATION: ECOG PERFORMANCE STATUS: 2 - Symptomatic, <50% confined to bed  Filed Vitals:   02/06/14 1341  BP: 141/88  Pulse: 81  Temp: 97.2 F (36.2 C)  Resp: 18   Filed Weights   02/06/14 1341  Weight: 85 lb 6.4 oz (38.737 kg)    GENERAL:alert, no distress and comfortable. She looks thin and cachectic Musculoskeletal:no cyanosis of digits and no clubbing . Significant lymphedema on the left upper extremity is seen NEURO: alert & oriented x 3 with fluent speech,  no focal motor/sensory deficits  LABORATORY DATA:  I have reviewed the data as listed    Component Value Date/Time   NA 141 01/21/2014 1450   NA 138 08/07/2013 0330   K 3.8 01/21/2014 1450   K 3.0* 08/07/2013 0330   CL 99 08/07/2013 0330   CO2 29 01/21/2014 1450   CO2 28 08/07/2013 0330   GLUCOSE 101 01/21/2014 1450   GLUCOSE 120* 08/07/2013 0330   BUN 11.5 01/21/2014 1450   BUN 11 08/07/2013 0330   CREATININE 0.8 01/21/2014  1450   CREATININE 0.75 08/07/2013 0330   CALCIUM 9.6 01/21/2014 1450   CALCIUM 8.8 08/07/2013 0330   PROT 7.3 01/21/2014 1450   PROT 6.1 11/15/2010 0600   ALBUMIN 3.4* 01/21/2014 1450   ALBUMIN 2.8* 11/15/2010 0600   AST 18 01/21/2014 1450   AST 39* 11/15/2010 0600   ALT 14 01/21/2014 1450   ALT 64* 11/15/2010 0600   ALKPHOS 93 01/21/2014 1450   ALKPHOS 86 11/15/2010 0600   BILITOT 0.31 01/21/2014 1450   BILITOT 0.6 11/15/2010 0600   GFRNONAA 76* 08/07/2013 0330   GFRAA 88* 08/07/2013 0330    No results found for this basename: SPEP,  UPEP,   kappa and lambda light chains    Lab Results  Component Value Date   WBC 9.1 01/21/2014   NEUTROABS 6.7* 01/21/2014   HGB 13.0 01/21/2014   HCT 40.0 01/21/2014   MCV 95.2 01/21/2014   PLT 417* 01/21/2014      Chemistry      Component Value Date/Time   NA 141 01/21/2014 1450   NA 138 08/07/2013 0330   K 3.8 01/21/2014 1450   K 3.0* 08/07/2013 0330   CL 99 08/07/2013 0330   CO2 29 01/21/2014 1450   CO2 28 08/07/2013 0330   BUN 11.5 01/21/2014 1450   BUN 11 08/07/2013 0330   CREATININE 0.8 01/21/2014 1450   CREATININE 0.75 08/07/2013 0330      Component Value Date/Time   CALCIUM 9.6 01/21/2014 1450   CALCIUM 8.8 08/07/2013 0330   ALKPHOS 93 01/21/2014 1450   ALKPHOS 86 11/15/2010 0600   AST 18 01/21/2014 1450   AST 39* 11/15/2010 0600   ALT 14 01/21/2014 1450   ALT 64* 11/15/2010 0600   BILITOT 0.31 01/21/2014 1450   BILITOT 0.6 11/15/2010 0600     RADIOGRAPHIC STUDIES: I reviewed the imaging study myself and agree with the interpretation I have personally reviewed the radiological images as listed and agreed with the findings in the report.  ASSESSMENT & PLAN:  #1 IgM kappa MGUS I discussed with the husband the natural history of MGUS. I will bring her back in 6 months with repeat blood work. There is nothing to suggest disease progression to warrant treatment #2 remote history of breast cancer Clinically, she has no evidence of recurrence #3  lymphedema I recommend continue conservative management with sleeve #4 urinary incontinence This could be related to progression of her dementia. I recommend gynecology her review #5 adnexal masses Cause is unknown. I recommend gynecology referral. #6 anorexia with weight loss Cause is unknown. There is no signs of active cancer. I recommend a trial of Remeron.  Orders Placed This Encounter  Procedures  . CBC with Differential    Standing Status: Future     Number of Occurrences:      Standing Expiration Date: 02/06/2015  . Comprehensive metabolic panel    Standing Status: Future     Number of Occurrences:  Standing Expiration Date: 02/06/2015  . Lactate dehydrogenase    Standing Status: Future     Number of Occurrences:      Standing Expiration Date: 02/06/2015  . SPEP & IFE with QIG    Standing Status: Future     Number of Occurrences:      Standing Expiration Date: 02/06/2015  . Kappa/lambda light chains    Standing Status: Future     Number of Occurrences:      Standing Expiration Date: 02/06/2015  . Beta 2 microglobulin, serum    Standing Status: Future     Number of Occurrences:      Standing Expiration Date: 02/07/2015  . Ambulatory referral to Gynecology    Referral Priority:  Routine    Referral Type:  Consultation    Referral Reason:  Specialty Services Required    Requested Specialty:  Gynecology    Number of Visits Requested:  1  . Ambulatory referral to Social Work    Referral Priority:  Routine    Referral Type:  Consultation    Referral Reason:  Specialty Services Required    Number of Visits Requested:  1   All questions were answered. The patient knows to call the clinic with any problems, questions or concerns. No barriers to learning was detected. I spent 35 minutes counseling the patient face to face. The total time spent in the appointment was 40 minutes and more than 50% was on counseling and review of test results     Heath Lark, MD 02/06/2014  4:46 PM

## 2014-02-06 NOTE — Telephone Encounter (Signed)
Brief Outpatient Oncology Nutrition Note  Patient has been identified to be at risk on malnutrition screen.  Wt Readings from Last 10 Encounters:  02/06/14 85 lb 6.4 oz (38.737 kg)  01/21/14 85 lb 11.2 oz (38.873 kg)  08/09/13 87 lb 11.9 oz (39.8 kg)      Dx:  Adnexal mass, MGUS.  Patient of Dr. Alvy Bimler.  Called due to low weight.  Patient with dementia and newly discovered mass above.  Last seen by inpatient RD 10/14 and weight was 84 lbs at that time.  Patient/husband were unavailable.  Message left with contact information for the [facility] for any nutrition related questions or needs.  Antonieta Iba, RD, LDN

## 2014-02-06 NOTE — Telephone Encounter (Signed)
gave pt apt for lab and MD for October and November , pt will be scheduled for Gyn next week due to most offices are closed on Friday afternoon

## 2014-02-09 ENCOUNTER — Encounter: Payer: Self-pay | Admitting: *Deleted

## 2014-02-09 NOTE — Progress Notes (Signed)
[place] Psychosocial Distress Screening Clinical Social Work  Clinical Social Work was referred by distress screening protocol.  The patient scored a 5 on the Psychosocial Distress Thermometer which indicates moderate distress. Clinical Social Worker reviewed chart to assess for distress and other psychosocial needs. All concerns for distress are physical in nature and were addressed by RN/MD, no current psychosocial concerns evident on chart review.   ONCBCN DISTRESS SCREENING 02/06/2014  Elta [place] the number that describes how much distress you have been experiencing in the past week 5  Physical Problem type Pain;Getting around;Bathing/dressing;Loss of appetitie;Changes in urination;Swollen arms/legs;Other (comment)    Clinical Social Worker follow up needed: no  Loren Racer, [employer] Social Worker [facility] for [place] Wednesday, Thursday and Friday Phone: [phone] Fax: [phone]

## 2014-02-16 ENCOUNTER — Telehealth: Payer: Self-pay | Admitting: Dietician

## 2014-02-16 NOTE — Telephone Encounter (Signed)
Brief Outpatient Oncology Nutrition Note  Patient has been identified to be at risk on malnutrition screen.  Wt Readings from Last 10 Encounters:  02/06/14 85 lb 6.4 oz (38.737 kg)  01/21/14 85 lb 11.2 oz (38.873 kg)  08/09/13 87 lb 11.9 oz (39.8 kg)     Dx:  Adnexal mass, MGUS,  Patient orf Dr. Alvy Bimler.  Called patient due to low weight.  Spoke with patient.  (Hx of dementia).  Poor intake per husband.  Recommended patient drink 2 Ensure per day.  She was relaying this information to her husband.    [place] RD available.  [name], RD, LDN

## 2014-07-28 ENCOUNTER — Telehealth: Payer: Self-pay | Admitting: *Deleted

## 2014-07-28 NOTE — Telephone Encounter (Signed)
Husband called to cancel appointments, pt does not want to repeat tests. Is following up with PCP

## 2014-08-03 ENCOUNTER — Other Ambulatory Visit: Payer: PRIVATE HEALTH INSURANCE

## 2014-08-06 ENCOUNTER — Other Ambulatory Visit: Payer: PRIVATE HEALTH INSURANCE

## 2014-08-10 ENCOUNTER — Ambulatory Visit: Payer: PRIVATE HEALTH INSURANCE | Admitting: Hematology and Oncology

## 2014-09-02 IMAGING — CT CT CHEST W/ CM
2 of 5 series · 16 of 46 positions shown, 18 images · IV contrast (OMNIPAQUE)
Comparison: Chest CTA only on 10/22/2010 ; no previous abdomen or
pelvis CTs

CLINICAL DATA: Followup breast carcinoma. Weight loss. Previous
left mastectomy, chemotherapy, and radiation therapy.

EXAM:
CT CHEST, ABDOMEN, AND PELVIS WITH CONTRAST
TECHNIQUE: Multidetector CT imaging of the chest, abdomen and pelvis was
performed following the standard protocol during bolus
administration of intravenous contrast.
CONTRAST:  80mL OMNIPAQUE IOHEXOL 300 MG/ML  SOLN

[Series 2: cap with st · axial · 0.53mm/px · z∈[-158,+346]mm · 13 of 115 slices shown, 15 images]
[im 7/115  soft-tissue]
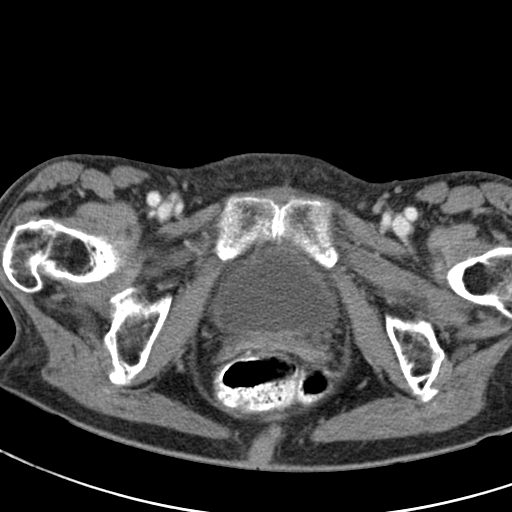
[im 7/115  bone]
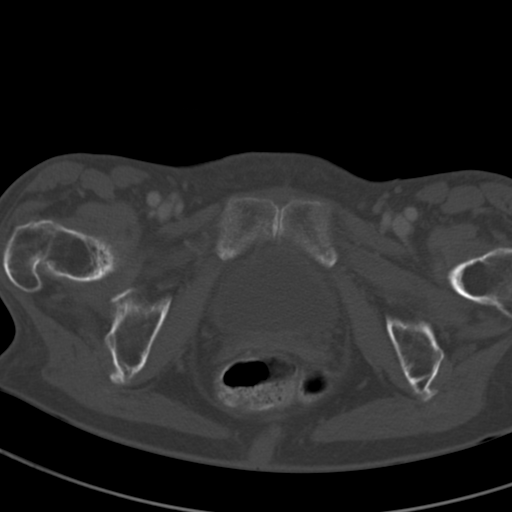
[im 14/115  soft-tissue]
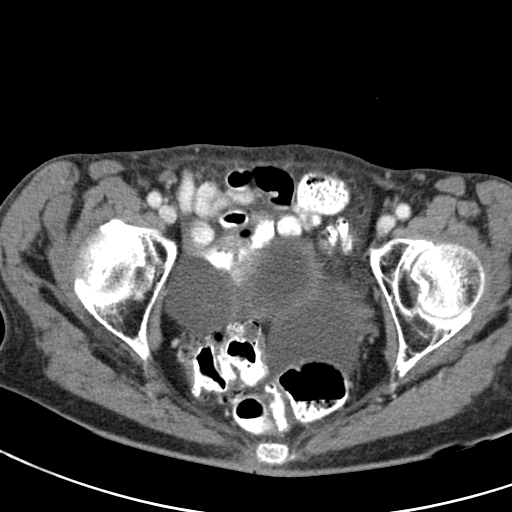
[im 27/115  soft-tissue]
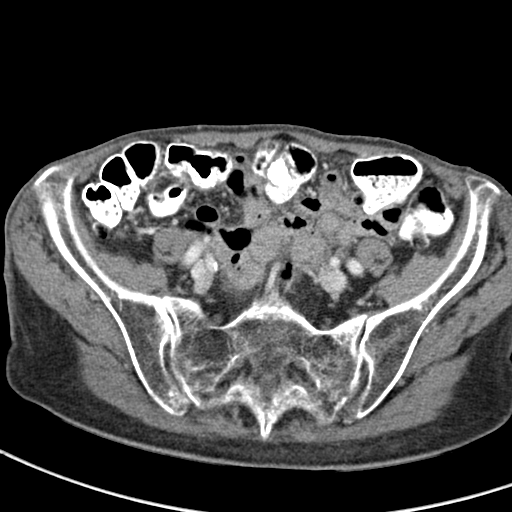
[im 34/115  soft-tissue]
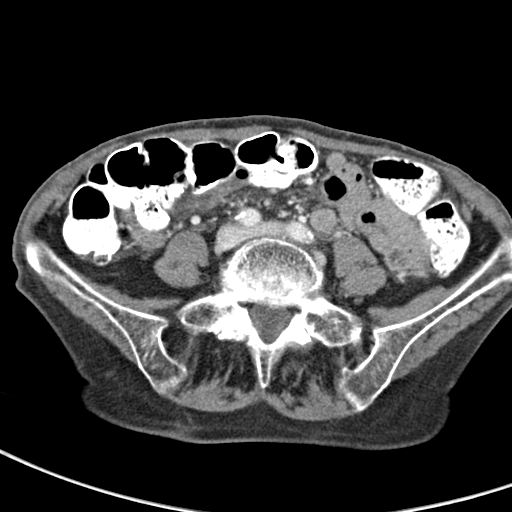
[im 41/115  soft-tissue]
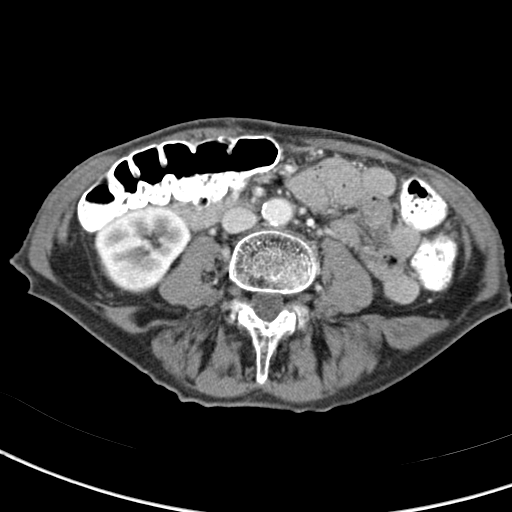
[im 47/115  soft-tissue]
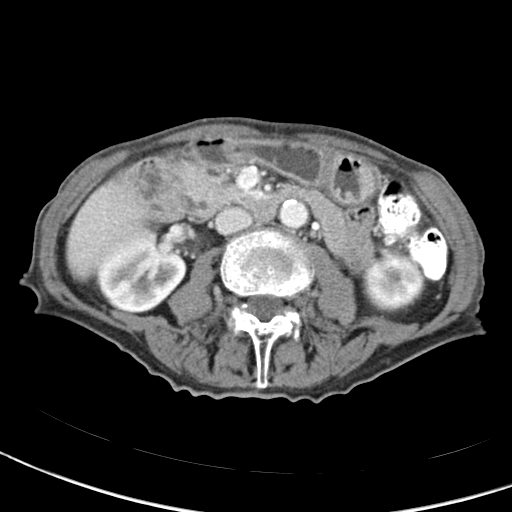
[im 61/115  soft-tissue]
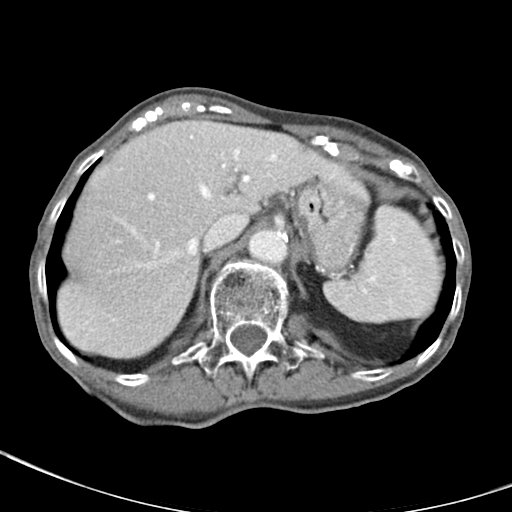
[im 68/115  soft-tissue]
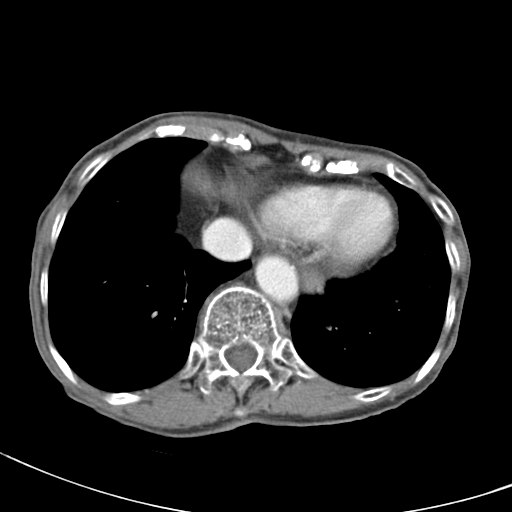
[im 74/115  soft-tissue]
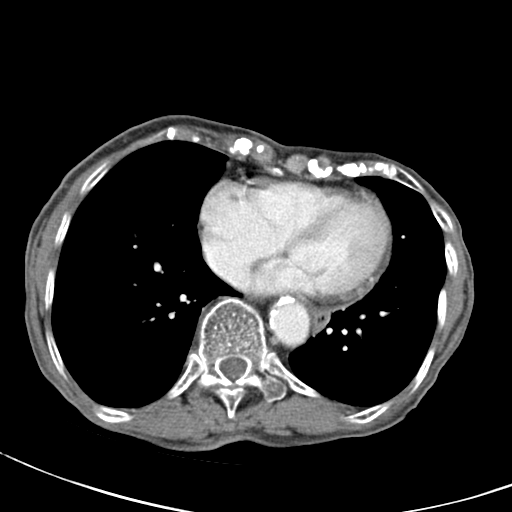
[im 74/115  bone]
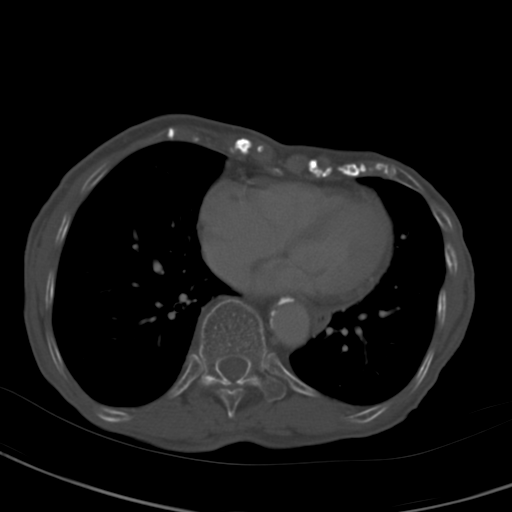
[im 81/115  soft-tissue]
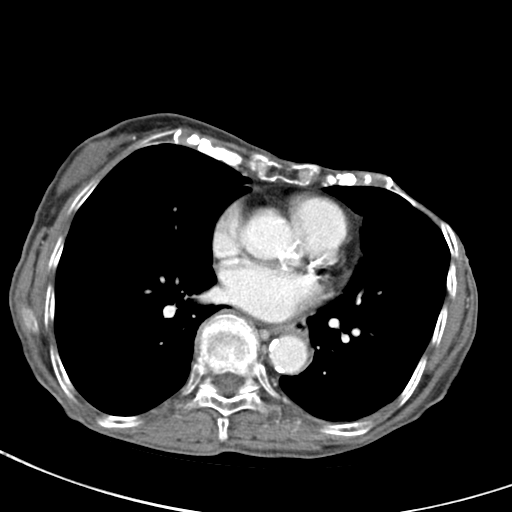
[im 88/115  soft-tissue]
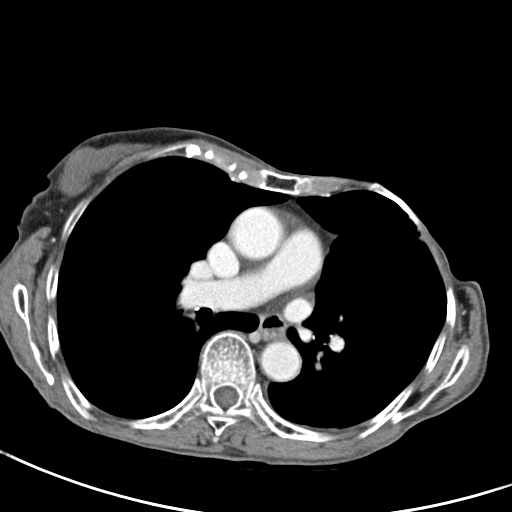
[im 101/115  soft-tissue]
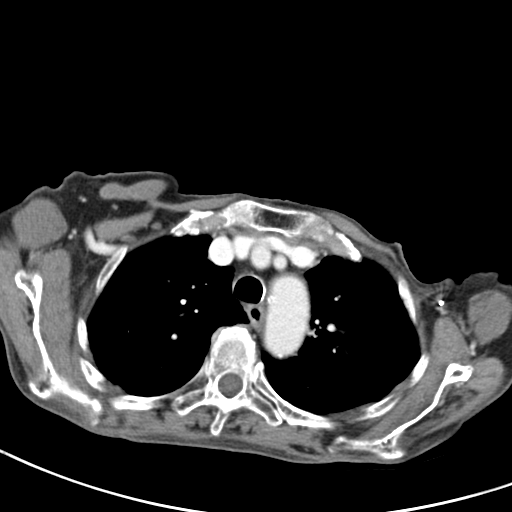
[im 108/115  soft-tissue]
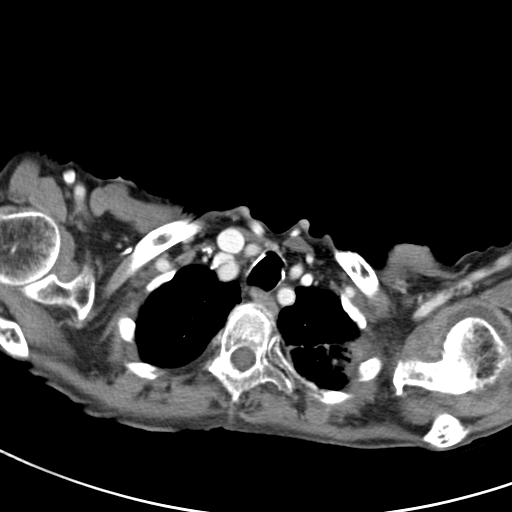

[Series 602: <mpr thick range> · coronal · 1.12mm/px · 3 of 73 slices shown]
[im 25/73  soft-tissue]
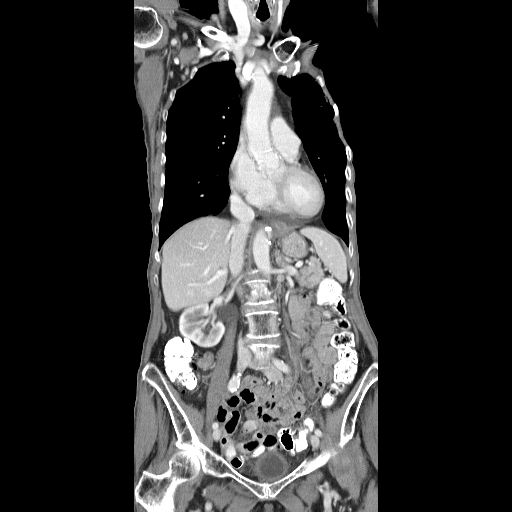
[im 33/73  soft-tissue]
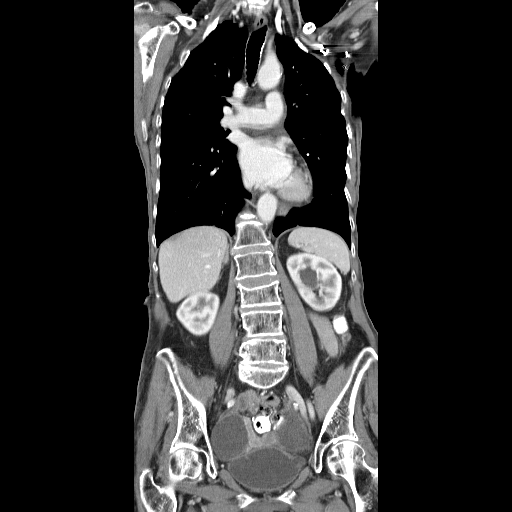
[im 41/73  soft-tissue]
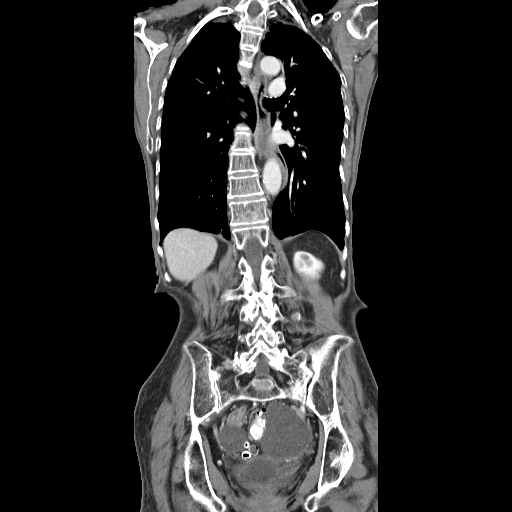

[16 of 46 positions shown; findings below may reference images not displayed]

FINDINGS: CT CHEST FINDINGS

Postop changes from previous left mastectomy remains stable. No
evidence of axillary lymphadenopathy or chest wall mass. Small less
than 1 cm right axillary lymph nodes are not pathologically
enlarged. Radiation changes in the anterior left lung are also
stable.

No suspicious pulmonary nodules or masses are identified. No
evidence of pulmonary infiltrate or central endobronchial
obstruction. No evidence of pleural or pericardial effusion. No
suspicious bone lesions are identified.

CT ABDOMEN AND PELVIS FINDINGS

The liver, gallbladder, pancreas, spleen, and both adrenal glands
are normal in appearance. Left renal parapelvic cyst is seen however
there is no evidence of renal masses or hydronephrosis.

A benign appearing cystic lesion is seen in the left adnexa
measuring 5 x 7 cm, and in the right adnexa measuring 4.3 x 4.9 cm.
These are likely ovarian in origin. Prior hysterectomy noted. No
evidence of ascites. No lymphadenopathy identified within the pelvis
or abdomen.

No evidence of inflammatory process or abscess. No evidence of bowel
wall thickening or dilatation. Left colonic diverticulosis is noted,
however there is no evidence of diverticulitis. No suspicious bone
lesions identified.
IMPRESSION: No definite evidence of of recurrent or metastatic carcinoma within
the chest, abdomen, or pelvis.

Bilateral benign appearing cystic lesions in both adnexal regions,
measuring 7 cm on the left and 5 cm the right. Benign series cystic
ovarian neoplasms cannot be excluded in a postmenopausal female.
Recommend continued followup by CT in 6 months, or alternatively
pelvic MRI without and with contrast would be preferred exam for
further characterization given lesion size. This recommendation
follows ACR consensus guidelines: White Paper of the ACR Incidental
Findings Committee II on Adnexal Findings. [HOSPITAL]
[DATE].

Diverticulosis. No radiographic evidence of diverticulitis.

## 2014-09-02 IMAGING — CR DG BONE SURVEY MET
10 series · 10 of 10 positions shown · non-contrast
Comparison: CTs of the chest, abdomen and pelvis same date.

CLINICAL DATA: Staging multiple myeloma. History of breast cancer.
Weight loss with neck pain.

EXAM:
METASTATIC BONE SURVEY

[w chest pa]
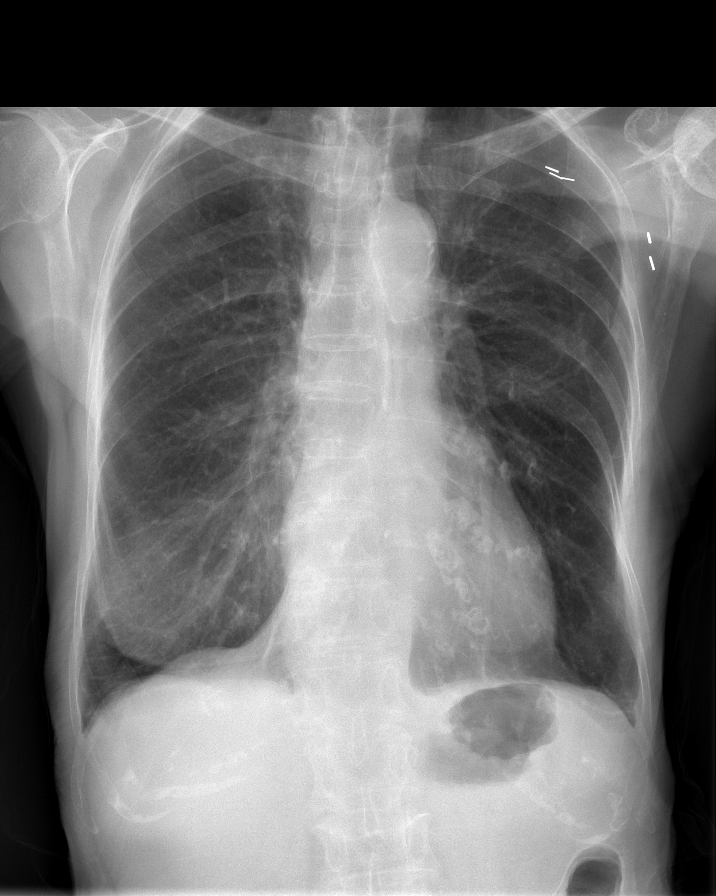

[w c-spine lat]
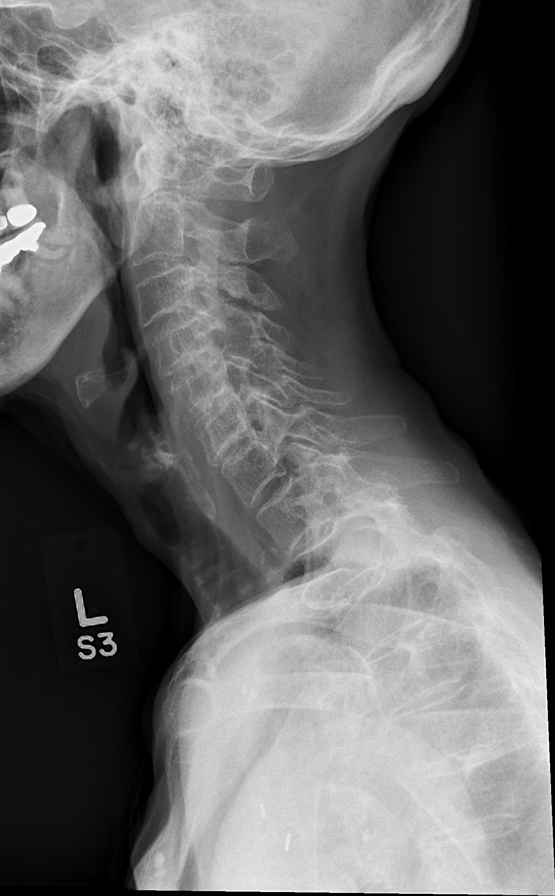

[w skull lat *]
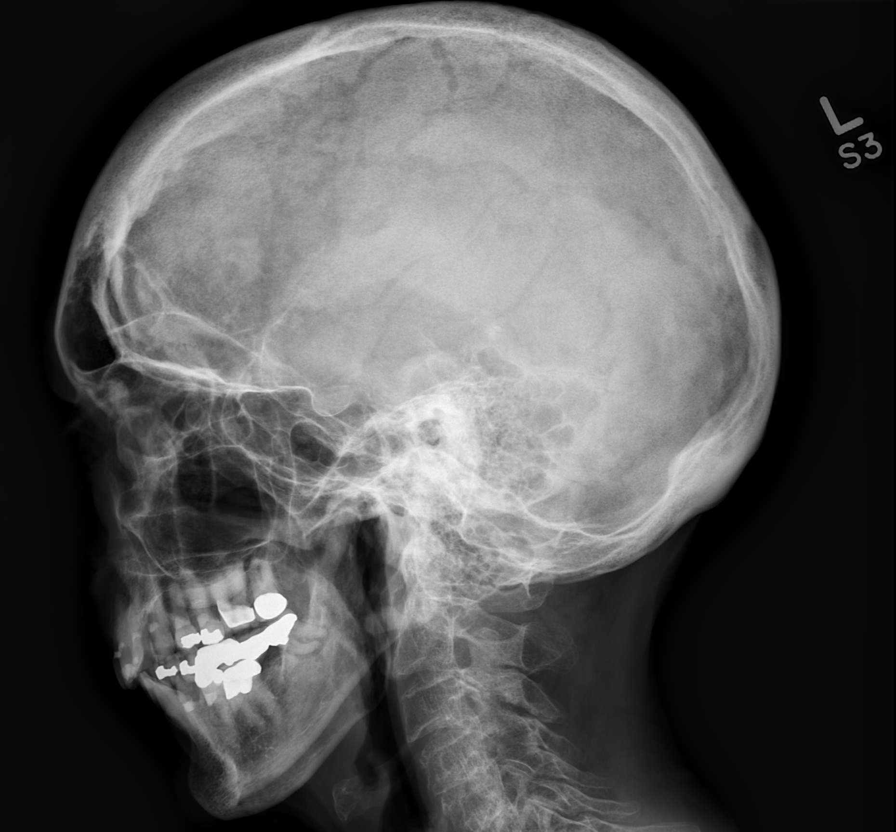

[w c-spine a.p. *]
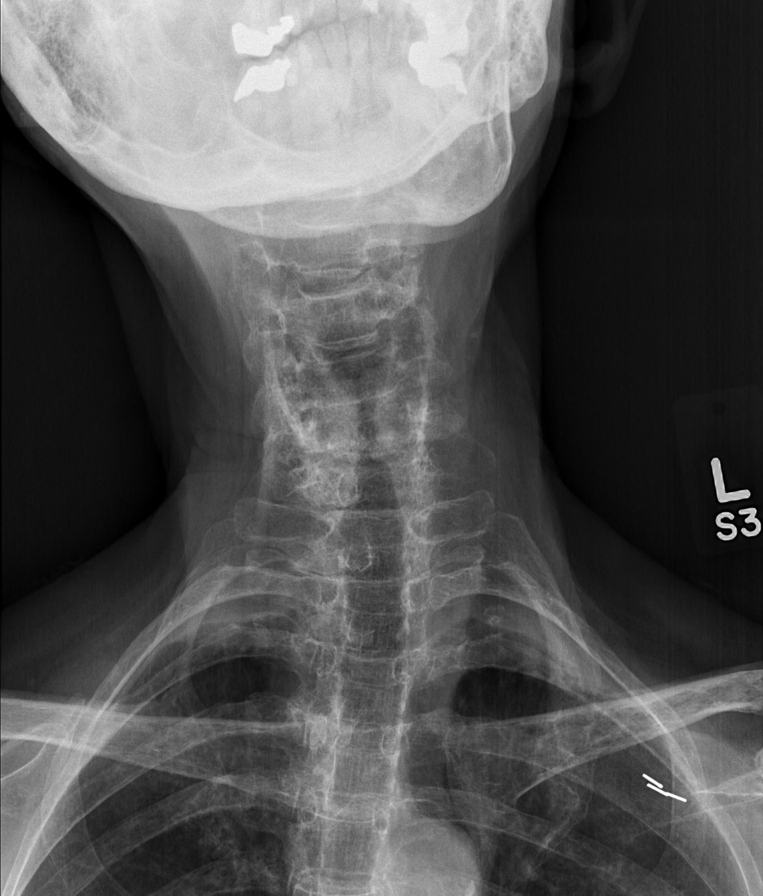

[t t-spine a.p.]
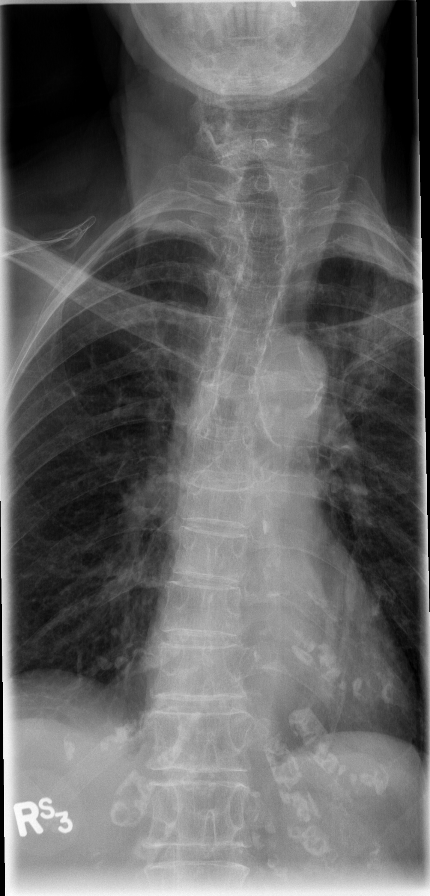

[t l-spine a.p.]
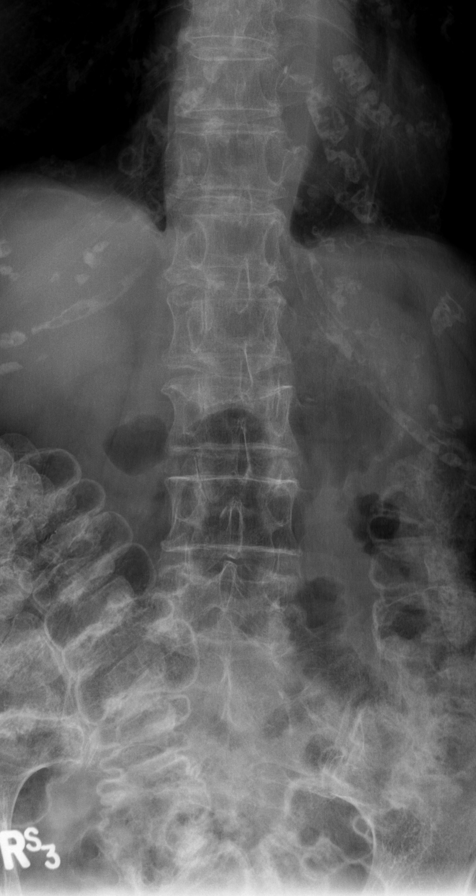

[t pelvis a.p.]
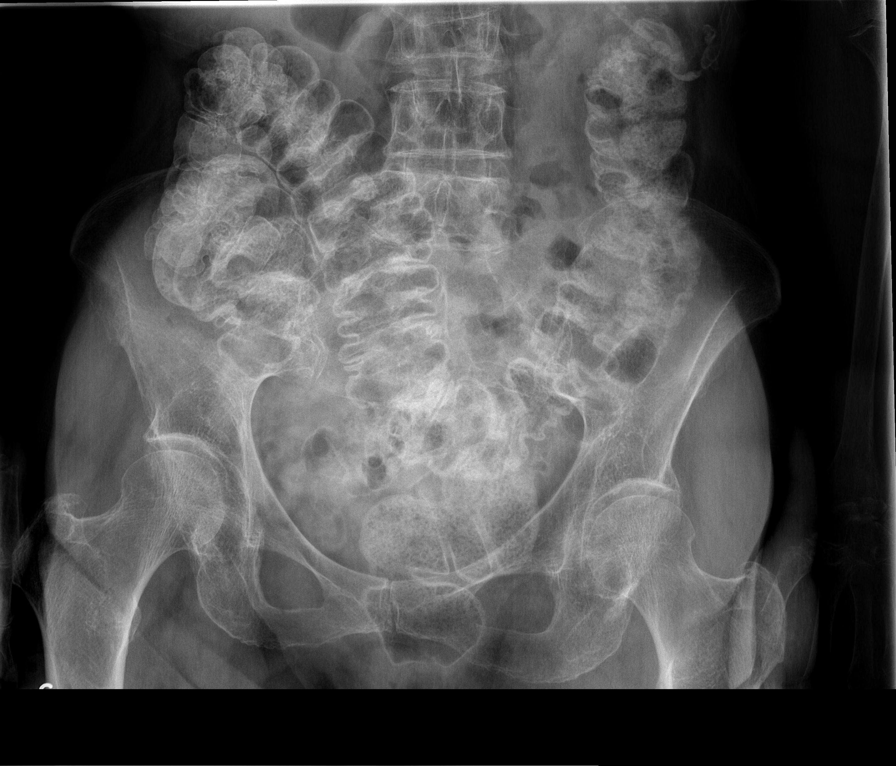

[t femur with hip  ap left]
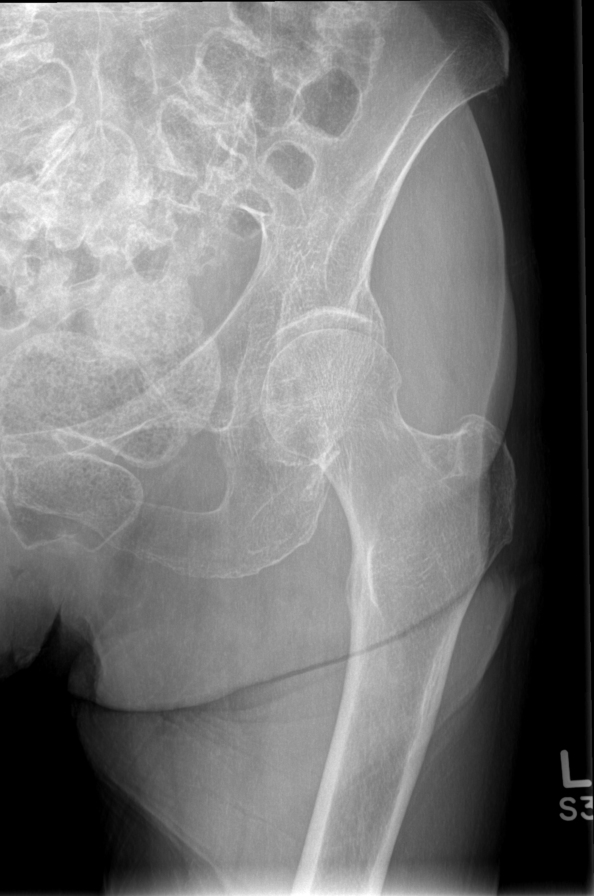

[t femur with knee ap left]
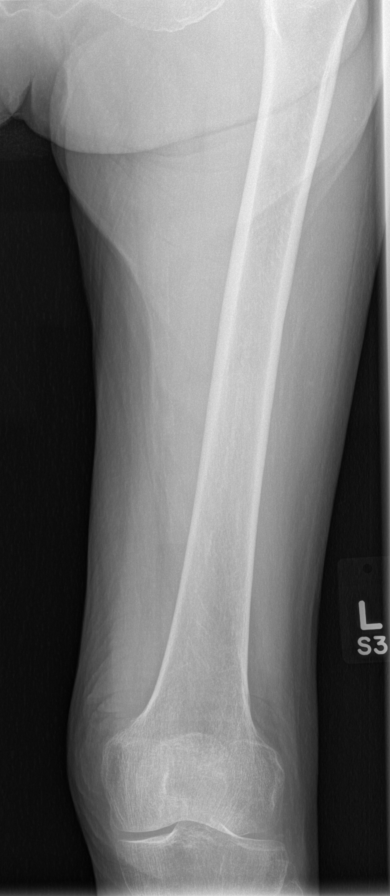

[t femur with hip  ap right]
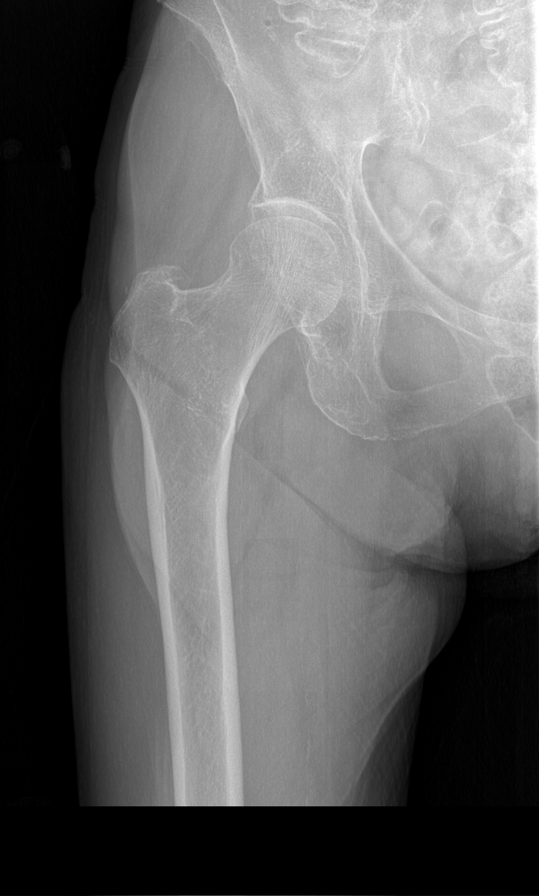

[10 of 10 positions shown; findings below may reference images not displayed]

FINDINGS: The bones are diffusely demineralized. No focal lytic lesions are
identified. Compression deformities involving the superior endplates
of T8 and L3 are noted, not appearing acute on today's CTs. Biapical
pleural scarring and left upper lobe scarring are noted. There are
surgical clips within the left axilla. There is irregular left
glenohumeral joint space loss with subchondral sclerosis.
IMPRESSION: No radiographic evidence of multiple myeloma. The bones are
diffusely demineralized with nonacute appearing superior endplate
compression deformities at T8 and L3.

## 2014-10-22 ENCOUNTER — Ambulatory Visit
Admission: RE | Admit: 2014-10-22 | Discharge: 2014-10-22 | Disposition: A | Discharge status: Home / Self Care | Payer: Medicare Other | Source: Ambulatory Visit | Attending: Internal Medicine | Admitting: Internal Medicine

## 2014-10-22 ENCOUNTER — Other Ambulatory Visit: Payer: Self-pay | Admitting: Internal Medicine

## 2014-10-22 DIAGNOSIS — M546 Pain in thoracic spine: Secondary | ICD-10-CM

## 2015-09-18 ENCOUNTER — Emergency Department (HOSPITAL_COMMUNITY)
Admission: EM | Admit: 2015-09-18 | Discharge: 2015-09-18 | Disposition: A | Discharge status: Home / Self Care | Payer: Medicare Other | Attending: Physician Assistant | Admitting: Physician Assistant

## 2015-09-18 ENCOUNTER — Emergency Department (HOSPITAL_COMMUNITY): Payer: Medicare Other

## 2015-09-18 ENCOUNTER — Encounter (HOSPITAL_COMMUNITY): Payer: Self-pay | Admitting: *Deleted

## 2015-09-18 DIAGNOSIS — W01198A Fall on same level from slipping, tripping and stumbling with subsequent striking against other object, initial encounter: Secondary | ICD-10-CM | POA: Diagnosis not present

## 2015-09-18 DIAGNOSIS — E039 Hypothyroidism, unspecified: Secondary | ICD-10-CM | POA: Insufficient documentation

## 2015-09-18 DIAGNOSIS — F0390 Unspecified dementia without behavioral disturbance: Secondary | ICD-10-CM | POA: Insufficient documentation

## 2015-09-18 DIAGNOSIS — Y998 Other external cause status: Secondary | ICD-10-CM | POA: Diagnosis not present

## 2015-09-18 DIAGNOSIS — Z853 Personal history of malignant neoplasm of breast: Secondary | ICD-10-CM | POA: Insufficient documentation

## 2015-09-18 DIAGNOSIS — Z79899 Other long term (current) drug therapy: Secondary | ICD-10-CM | POA: Diagnosis not present

## 2015-09-18 DIAGNOSIS — S51811A Laceration without foreign body of right forearm, initial encounter: Secondary | ICD-10-CM | POA: Insufficient documentation

## 2015-09-18 DIAGNOSIS — Y92231 Patient bathroom in hospital as the place of occurrence of the external cause: Secondary | ICD-10-CM | POA: Diagnosis not present

## 2015-09-18 DIAGNOSIS — E079 Disorder of thyroid, unspecified: Secondary | ICD-10-CM | POA: Diagnosis not present

## 2015-09-18 DIAGNOSIS — W19XXXA Unspecified fall, initial encounter: Secondary | ICD-10-CM

## 2015-09-18 DIAGNOSIS — S0181XA Laceration without foreign body of other part of head, initial encounter: Secondary | ICD-10-CM | POA: Insufficient documentation

## 2015-09-18 DIAGNOSIS — M1990 Unspecified osteoarthritis, unspecified site: Secondary | ICD-10-CM | POA: Insufficient documentation

## 2015-09-18 DIAGNOSIS — Z23 Encounter for immunization: Secondary | ICD-10-CM | POA: Diagnosis not present

## 2015-09-18 DIAGNOSIS — Y9389 Activity, other specified: Secondary | ICD-10-CM | POA: Diagnosis not present

## 2015-09-18 LAB — COMPREHENSIVE METABOLIC PANEL
ALBUMIN: 3.1 g/dL — AB (ref 3.5–5.0)
ALT: 12 U/L — AB (ref 14–54)
ANION GAP: 9 (ref 5–15)
AST: 21 U/L (ref 15–41)
Alkaline Phosphatase: 75 U/L (ref 38–126)
BUN: 16 mg/dL (ref 6–20)
CHLORIDE: 104 mmol/L (ref 101–111)
CO2: 26 mmol/L (ref 22–32)
Calcium: 9 mg/dL (ref 8.9–10.3)
Creatinine, Ser: 0.87 mg/dL (ref 0.44–1.00)
GFR calc non Af Amer: 59 mL/min — ABNORMAL LOW (ref >60)
Glucose, Bld: 112 mg/dL — ABNORMAL HIGH (ref 65–99)
Potassium: 4 mmol/L (ref 3.5–5.1)
Sodium: 139 mmol/L (ref 135–145)
Total Bilirubin: 0.8 mg/dL (ref 0.3–1.2)
Total Protein: 6.3 g/dL — ABNORMAL LOW (ref 6.5–8.1)

## 2015-09-18 LAB — URINALYSIS, ROUTINE W REFLEX MICROSCOPIC
BILIRUBIN URINE: NEGATIVE
Glucose, UA: NEGATIVE mg/dL
KETONES UR: NEGATIVE mg/dL
Leukocytes, UA: NEGATIVE
NITRITE: NEGATIVE
PROTEIN: NEGATIVE mg/dL
Specific Gravity, Urine: 1.01 (ref 1.005–1.030)
pH: 7.5 (ref 5.0–8.0)

## 2015-09-18 LAB — URINE MICROSCOPIC-ADD ON

## 2015-09-18 LAB — PROTIME-INR
INR: 1.09 (ref 0.00–1.49)
PROTHROMBIN TIME: 14.3 s (ref 11.6–15.2)

## 2015-09-18 LAB — CBC WITH DIFFERENTIAL/PLATELET
BASOS ABS: 0 10*3/uL (ref 0.0–0.1)
Basophils Relative: 0 %
Eosinophils Absolute: 0.1 10*3/uL (ref 0.0–0.7)
Eosinophils Relative: 1 %
HEMATOCRIT: 42.8 % (ref 36.0–46.0)
Hemoglobin: 13.6 g/dL (ref 12.0–15.0)
LYMPHS ABS: 1.1 10*3/uL (ref 0.7–4.0)
LYMPHS PCT: 11 %
MCH: 31 pg (ref 26.0–34.0)
MCHC: 31.8 g/dL (ref 30.0–36.0)
MCV: 97.5 fL (ref 78.0–100.0)
MONO ABS: 0.7 10*3/uL (ref 0.1–1.0)
Monocytes Relative: 7 %
NEUTROS ABS: 8 10*3/uL — AB (ref 1.7–7.7)
Neutrophils Relative %: 81 %
Platelets: 340 10*3/uL (ref 150–400)
RBC: 4.39 MIL/uL (ref 3.87–5.11)
RDW: 13.5 % (ref 11.5–15.5)
WBC: 9.9 10*3/uL (ref 4.0–10.5)

## 2015-09-18 LAB — I-STAT TROPONIN, ED: Troponin i, poc: 0 ng/mL (ref 0.00–0.08)

## 2015-09-18 MED ORDER — TETANUS-DIPHTH-ACELL PERTUSSIS 5-2.5-18.5 LF-MCG/0.5 IM SUSP
0.5000 mL | Freq: Once | INTRAMUSCULAR | Status: AC
  Administered 2015-09-18: 0.5 mL via INTRAMUSCULAR
  Filled 2015-09-18: qty 0.5

## 2015-09-18 NOTE — ED Notes (Signed)
Care management at bedside.

## 2015-09-18 NOTE — Care Management Note (Signed)
Case Management Note  Patient Details  Name: [name] MRN: [id number] Date of Birth: 21-Jul-1927  Subjective/Objective:     81 y.o. F seen in the ED after a mechanical fall this am when she suffered a laceration to her head. Cared for by her 54 y.o. Spouse in a two story home where she sleeps on first level and he upstairs at night. They have had [place] services in past, as well as several falls with admission to hospital and 21 day STSNF admissions 2012 and 2014. Ambulation is progressively declining and more of a shuffle. Have RW and shower DME. Spouse has arranged AT HOME services which are Custodial in nature that were scheduled to start today. Next PCP visit Jan 2017. Will not be admitted to hospital today.                Action/Plan:Billy and I agreed that HHPT needed to asses Level of functioning in home. [facility] chosen to provide this service and arranged. No further CM needs but will be available should additional discharge needs arise.   Expected Discharge Date:                  Expected Discharge Plan:     In-House Referral:     Discharge planning Services  CM Consult  Post Acute Care Choice:    Choice offered to:     DME Arranged:    DME Agency:     HH Arranged:  PT [place]:  [place]  Status of Service:  Completed, signed off  Medicare Important Message Given:    Date Medicare IM Given:    Medicare IM give by:    Date Additional Medicare IM Given:    Additional Medicare Important Message give by:     If discussed at [place] of Stay Meetings, dates discussed:    Additional Comments:  Delrae Sawyers, RN 09/18/2015, 12:26 PM

## 2015-09-18 NOTE — ED Notes (Signed)
Pt ambulates with no problems with a walker.

## 2015-09-18 NOTE — Discharge Instructions (Signed)
You will need repeat radiographs of right wrist in 7-10 days as there may be an occult fracture.

## 2015-09-18 NOTE — ED Notes (Signed)
Pt Son Cecilie Lowers: [phone]

## 2015-09-18 NOTE — ED Notes (Signed)
Patient transported to X-ray 

## 2015-09-18 NOTE — ED Notes (Signed)
Pt is in stable condition upon d/c and is escorted from ED via wheelchair. 

## 2015-09-18 NOTE — ED Provider Notes (Signed)
CSN: [id number]     Arrival date & time 09/18/15  [id number] History   First MD Initiated Contact with Patient 09/18/15 [phone]     Chief Complaint  Patient presents with  . Fall     (Consider location/radiation/quality/duration/timing/severity/associated sxs/prior Treatment) Patient is a 81 y.o. female presenting with fall.  Fall Pertinent negatives include no chest pain, no abdominal pain and no shortness of breath.     Patient is an 81 year old female with history of dementia, breast cancer status post left mastectomy in the 1970s. Patient lives at home with her husband. Her husband found her having fallen off the toilet this morning. Patient hit her right head. Patient has no short-term memory at baseline and does not remember what happened. Patient denies any pain on arrival.   Level 5 caveat: dementia  Past Medical History  Diagnosis Date  . Arthritis   . Thyroid disease   . Gout   . Rheumatoid arthritis(714.0)   . Cancer [facility])     breast CA  . Hypothyroidism   . Short-term memory loss   . Incontinence of urine   . Weakness of both legs    Past Surgical History  Procedure Laterality Date  . Abdominal hysterectomy    . Breast surgery  1974    mastectomy left  . Mastectomy  1974    left breast  . Lumbar fracture     Family History  Problem Relation Age of Onset  . Cancer Sister     unknown cancer   Social History  Substance Use Topics  . Smoking status: Never Smoker   . Smokeless tobacco: Never Used  . Alcohol Use: No   OB History    No data available     Review of Systems  Constitutional: Negative for activity change.  Respiratory: Negative for shortness of breath.   Cardiovascular: Negative for chest pain.  Gastrointestinal: Negative for abdominal pain.      Allergies  Review of patient's allergies indicates no known allergies.  Home Medications   Prior to Admission medications   Medication Sig Start Date End Date Taking? Authorizing Provider   acetaminophen (TYLENOL) 500 MG tablet Take 500 mg by mouth every 6 (six) hours as needed.    Historical Provider, MD  alendronate (FOSAMAX) 70 MG tablet Take 70 mg by mouth once a week. Take with a full glass of water on an empty stomach.    Historical Provider, MD  Calcium Carbonate-Vitamin D (CALCIUM + D PO) Take by mouth daily.    Historical Provider, MD  donepezil (ARICEPT) 10 MG tablet Take 10 mg by mouth at bedtime.    Historical Provider, MD  levothyroxine (SYNTHROID, LEVOTHROID) 50 MCG tablet Take 1.5 tablets (75 mcg total) by mouth daily before breakfast. 08/08/13   Charlynne Cousins, MD  mirtazapine (REMERON) 15 MG tablet Take 1 tablet (15 mg total) by mouth at bedtime. 02/06/14   Ni Gorsuch, MD   BP 120/76 mmHg  Pulse 93  Temp(Src) 97.9 F (36.6 C) (Oral)  Resp 21  SpO2 91% Physical Exam  Constitutional: She appears well-developed and well-nourished.  Frail, thin  HENT:  Head: Normocephalic.  4 cm avulsion./skin tear to R forehead, crescent shaped.  Eyes: Conjunctivae are normal. Right eye exhibits no discharge.  Neck: Neck supple.  No c spine tenderness  Cardiovascular: Normal rate, regular rhythm and normal heart sounds.   No murmur heard. Pulmonary/Chest: Effort normal and breath sounds normal. She has no wheezes. She has  no rales.  Left masectomy  Abdominal: Soft. She exhibits no distension. There is no tenderness.  Musculoskeletal: Normal range of motion. She exhibits no edema.  L arm with lymphadenema.  R arm with pain to palpation of wrist and elbow, several skin tears  Neurological: No cranial nerve deficit.  oretinted to self and palce  Skin: Skin is warm and dry. No rash noted. She is not diaphoretic.  Bruising and thin skin diffusely  Psychiatric: She has a normal mood and affect. Her behavior is normal.  Nursing note and vitals reviewed.   ED Course  Procedures (including critical care time) Labs Review Labs Reviewed  CBC WITH DIFFERENTIAL/PLATELET  - Abnormal; Notable for the following:    Neutro Abs 8.0 (*)    All other components within normal limits  COMPREHENSIVE METABOLIC PANEL - Abnormal; Notable for the following:    Glucose, Bld 112 (*)    Total Protein 6.3 (*)    Albumin 3.1 (*)    ALT 12 (*)    GFR calc non Af Amer 59 (*)    All other components within normal limits  URINALYSIS, ROUTINE W REFLEX MICROSCOPIC (NOT AT [facility]) - Abnormal; Notable for the following:    APPearance CLOUDY (*)    Hgb urine dipstick SMALL (*)    All other components within normal limits  URINE MICROSCOPIC-ADD ON - Abnormal; Notable for the following:    Squamous Epithelial / LPF 0-5 (*)    Bacteria, UA MANY (*)    All other components within normal limits  PROTIME-INR  I-STAT TROPOININ, ED    Imaging Review Dg Chest 2 View  09/18/2015  CLINICAL DATA:  Pain following fall EXAM: CHEST  2 VIEW COMPARISON:  Chest CT January 29, 2014; chest radiograph November 06, 2010 ; thoracic spine radiographs October 22, 2014 FINDINGS: The lungs are mildly hyperexpanded. There is no edema or consolidation. There is slight scarring in the right base. The heart size and pulmonary vascularity are normal. No adenopathy. There is marked collapse of an upper thoracic vertebral body, stable. There are surgical clips in left axillary region. There is advanced arthropathy in the left shoulder. IMPRESSION: Lungs mildly hyperexpanded. Mild scarring right base. No edema or consolidation. Stable upper thoracic vertebral body compression fracture. Advanced arthropathy left shoulder. Electronically Signed   By: Lowella Grip III M.D.   On: 09/18/2015 10:30   Dg Elbow Complete Right  09/18/2015  CLINICAL DATA:  Pain following fall EXAM: RIGHT ELBOW - COMPLETE 3+ VIEW COMPARISON:  None. FINDINGS: Frontal, lateral, and bilateral oblique views were obtained. There is no demonstrable fracture or dislocation. No joint effusion. There is no appreciable joint space narrowing. Bones are  diffusely osteoporotic. There is a soft tissue defect posterior to the distal humerus. IMPRESSION: Soft tissue defect posteriorly. No fracture or dislocation. Bones are diffusely osteoporotic. Electronically Signed   By: Lowella Grip III M.D.   On: 09/18/2015 10:39   Dg Wrist Complete Right  09/18/2015  CLINICAL DATA:  Fall onto bathroom floor. EXAM: RIGHT WRIST - COMPLETE 3+ VIEW COMPARISON:  None. FINDINGS: There is severe arthropathy of the radial carpal joint with erosion of the proximal carpal row. This makes evaluation difficult however there is no clear evidence of acute fracture. There is erosion of the distal ulna additionally. On the lateral projection there is ventral angulation of the distal radius which is likely chronic. IMPRESSION: 1. No clear evidence of acute fracture of the wrist. 2. Severe arthropathy of the radial  carpal joint and proximal carpal row and distal ulna. 3. Recommend follow-up radiograph 7-10 days if pain consists persists as an occult fracture cannot be excluded. Electronically Signed   By: Suzy Bouchard M.D.   On: 09/18/2015 10:34   Ct Head Wo Contrast  09/18/2015  CLINICAL DATA:  Pain following fall.  History of breast carcinoma EXAM: CT HEAD WITHOUT CONTRAST CT CERVICAL SPINE WITHOUT CONTRAST TECHNIQUE: Multidetector CT imaging of the head and cervical spine was performed following the standard protocol without intravenous contrast. Multiplanar CT image reconstructions of the cervical spine were also generated. COMPARISON:  CT head and CT cervical spine August 07, 2013 FINDINGS: CT HEAD FINDINGS Moderate diffuse atrophy is stable. There is a small cavum septum pellucidum, an anatomic variant. There is no intracranial mass, hemorrhage, extra-axial fluid collection, or midline shift. There is small vessel disease throughout the centra semiovale bilaterally, stable. No acute infarct is evident. CT CERVICAL SPINE FINDINGS There is no fracture or spondylolisthesis.  Prevertebral soft tissues and predental space regions are normal. Bones are diffusely osteoporotic. There is moderate disc space narrowing at C3-4, C4-5, C5-6 and C6-7. There is facet hypertrophy at multiple levels bilaterally. No disc extrusion or stenosis. There is bilateral apical pleural thickening, more severe on the left than on the right, stable from prior study. There is calcification in each carotid artery. IMPRESSION: CT head: Stable atrophy with periventricular small vessel disease. No intracranial mass, hemorrhage, or extra-axial fluid collection. No acute infarct evident. CT cervical spine: No fracture or spondylolisthesis. Bones are diffusely osteoporotic. There is multilevel osteoarthritic change. There is carotid artery calcification bilaterally. Asymmetric apical pleural thickening noted, more severe on the left than on the right, stable. Electronically Signed   By: Lowella Grip III M.D.   On: 09/18/2015 10:52   Ct Cervical Spine Wo Contrast  09/18/2015  CLINICAL DATA:  Pain following fall.  History of breast carcinoma EXAM: CT HEAD WITHOUT CONTRAST CT CERVICAL SPINE WITHOUT CONTRAST TECHNIQUE: Multidetector CT imaging of the head and cervical spine was performed following the standard protocol without intravenous contrast. Multiplanar CT image reconstructions of the cervical spine were also generated. COMPARISON:  CT head and CT cervical spine August 07, 2013 FINDINGS: CT HEAD FINDINGS Moderate diffuse atrophy is stable. There is a small cavum septum pellucidum, an anatomic variant. There is no intracranial mass, hemorrhage, extra-axial fluid collection, or midline shift. There is small vessel disease throughout the centra semiovale bilaterally, stable. No acute infarct is evident. CT CERVICAL SPINE FINDINGS There is no fracture or spondylolisthesis. Prevertebral soft tissues and predental space regions are normal. Bones are diffusely osteoporotic. There is moderate disc space narrowing  at C3-4, C4-5, C5-6 and C6-7. There is facet hypertrophy at multiple levels bilaterally. No disc extrusion or stenosis. There is bilateral apical pleural thickening, more severe on the left than on the right, stable from prior study. There is calcification in each carotid artery. IMPRESSION: CT head: Stable atrophy with periventricular small vessel disease. No intracranial mass, hemorrhage, or extra-axial fluid collection. No acute infarct evident. CT cervical spine: No fracture or spondylolisthesis. Bones are diffusely osteoporotic. There is multilevel osteoarthritic change. There is carotid artery calcification bilaterally. Asymmetric apical pleural thickening noted, more severe on the left than on the right, stable. Electronically Signed   By: Lowella Grip III M.D.   On: 09/18/2015 10:52   I have personally reviewed and evaluated these images and lab results as part of my medical decision-making.   EKG Interpretation  Date/Time:  Saturday September 18 2015 09:37:45 EST Ventricular Rate:  100 PR Interval:  155 QRS Duration: 83 QT Interval:  351 QTC Calculation: 453 R Axis:   32 Text Interpretation:  Pacemaker spikes or artifacts Sinus tachycardia  Right atrial enlargement Probable left ventricular hypertrophy Nonspecific  T abnrm, anterolateral leads no acute ischemia No significant change since  last tracing Confirmed by Gerald Leitz ([id number]) on 09/18/2015 9:43:02  AM      MDM   Final diagnoses:  Accident due to mechanical fall without injury    Patient is a very pleasant  demented 81 year old female presenting after fall. Patient found next to the toilet this morning by husband. Patient has avulsion/skin tear to the right forehead. She has skin tears to the right arm. We will get CT head and neck. In addition we will get plain films of the right wrist and elbow which are causing her minimal pain on palpation. We will repair the skin tears with Dermabond and Steri-Strips. We  will update her tetanus.    9:54 AM Talked to husband.  He is on the way with a negithbor.   11:09 AM Pts husband here. He states that she fell trying to pull up her pants after using the bathroom. It sounds like patient is very weak at baseline. She lives in a first floor which is a dining room converted into the bedroom. They have not had any home health services. We will touch base with case management to increase these. Patient's son is on the way from [facility].   1:28 PM Studies negative. Patient seen by case management. Offered physical therapy at home as well as home services patient baseline unsteady with walker. No significant change from baseline. We will discharge patient home. Had long discussion with son and husband about the safety in the house. They're aware that they need to have patient being a different living situation to increase safety.    LACERATION REPAIR Performed by: Gardiner Sleeper Authorized by: Gardiner Sleeper Consent: Verbal consent obtained. Risks and benefits: risks, benefits and alternatives were discussed Consent given by: patient Patient identity confirmed: provided demographic data Prepped and Draped in normal sterile fashion Wound explored  Laceration Location: R forehead, and R forearm  Laceration Length: 4 cm each  No Foreign Bodies seen or palpated  Anesthesia: local infiltration  Irrigation method: cleaningd  Skin closure: Streistrip + dermanbond.  Patient tolerance: Patient tolerated the procedure well with no immediate complications.    Tesa Meadors Julio Alm, MD 09/18/15 1328

## 2015-09-18 NOTE — ED Notes (Signed)
Phlebotomy at bedside.

## 2015-09-18 NOTE — ED Notes (Signed)
Pt arrives from home via [facility]. Pt was found in the bathroom on the floor. Husband states he think the pt vagaled while using the restroom and states it has happened before. Pt has an avulsion over the right eyebrow, a skin tear to the right arm, and pain to the right wrist. Pt denies any memory of the fall, most likely rt a hx of dementia. Pt isn't on blood thinners and is mentating to norm per husband report to EMS. Pt is very pleasant upon arrival.

## 2016-11-14 ENCOUNTER — Inpatient Hospital Stay (HOSPITAL_COMMUNITY)
Admission: EM | Admit: 2016-11-14 | Discharge: 2016-11-17 | DRG: 470 | Disposition: A | Discharge status: Skilled Nursing Facility | Payer: Medicare Other | Attending: Family Medicine | Admitting: Family Medicine

## 2016-11-14 ENCOUNTER — Emergency Department (HOSPITAL_COMMUNITY): Payer: Medicare Other

## 2016-11-14 ENCOUNTER — Encounter (HOSPITAL_COMMUNITY): Payer: Self-pay | Admitting: Emergency Medicine

## 2016-11-14 DIAGNOSIS — S72001A Fracture of unspecified part of neck of right femur, initial encounter for closed fracture: Secondary | ICD-10-CM | POA: Diagnosis not present

## 2016-11-14 DIAGNOSIS — M069 Rheumatoid arthritis, unspecified: Secondary | ICD-10-CM | POA: Diagnosis present

## 2016-11-14 DIAGNOSIS — Z90710 Acquired absence of both cervix and uterus: Secondary | ICD-10-CM

## 2016-11-14 DIAGNOSIS — F0280 Dementia in other diseases classified elsewhere without behavioral disturbance: Secondary | ICD-10-CM | POA: Diagnosis present

## 2016-11-14 DIAGNOSIS — F0390 Unspecified dementia without behavioral disturbance: Secondary | ICD-10-CM | POA: Diagnosis present

## 2016-11-14 DIAGNOSIS — K5900 Constipation, unspecified: Secondary | ICD-10-CM | POA: Diagnosis present

## 2016-11-14 DIAGNOSIS — Z9012 Acquired absence of left breast and nipple: Secondary | ICD-10-CM

## 2016-11-14 DIAGNOSIS — Z853 Personal history of malignant neoplasm of breast: Secondary | ICD-10-CM

## 2016-11-14 DIAGNOSIS — E039 Hypothyroidism, unspecified: Secondary | ICD-10-CM | POA: Diagnosis present

## 2016-11-14 DIAGNOSIS — R32 Unspecified urinary incontinence: Secondary | ICD-10-CM | POA: Diagnosis present

## 2016-11-14 DIAGNOSIS — I4891 Unspecified atrial fibrillation: Secondary | ICD-10-CM | POA: Diagnosis present

## 2016-11-14 DIAGNOSIS — G309 Alzheimer's disease, unspecified: Secondary | ICD-10-CM | POA: Diagnosis present

## 2016-11-14 DIAGNOSIS — Z01811 Encounter for preprocedural respiratory examination: Secondary | ICD-10-CM

## 2016-11-14 DIAGNOSIS — S72002A Fracture of unspecified part of neck of left femur, initial encounter for closed fracture: Secondary | ICD-10-CM

## 2016-11-14 DIAGNOSIS — M25551 Pain in right hip: Secondary | ICD-10-CM | POA: Diagnosis not present

## 2016-11-14 DIAGNOSIS — Y92002 Bathroom of unspecified non-institutional (private) residence single-family (private) house as the place of occurrence of the external cause: Secondary | ICD-10-CM

## 2016-11-14 DIAGNOSIS — R52 Pain, unspecified: Secondary | ICD-10-CM

## 2016-11-14 DIAGNOSIS — W1830XA Fall on same level, unspecified, initial encounter: Secondary | ICD-10-CM | POA: Diagnosis present

## 2016-11-14 HISTORY — DX: Dementia in other diseases classified elsewhere, unspecified severity, without behavioral disturbance, psychotic disturbance, mood disturbance, and anxiety: F02.80

## 2016-11-14 HISTORY — DX: Alzheimer's disease, unspecified: G30.9

## 2016-11-14 LAB — CBC WITH DIFFERENTIAL/PLATELET
BASOS ABS: 0 10*3/uL (ref 0.0–0.1)
BASOS PCT: 0 %
EOS PCT: 0 %
Eosinophils Absolute: 0 10*3/uL (ref 0.0–0.7)
HCT: 39.7 % (ref 36.0–46.0)
Hemoglobin: 12.7 g/dL (ref 12.0–15.0)
Lymphocytes Relative: 3 %
Lymphs Abs: 0.6 10*3/uL — ABNORMAL LOW (ref 0.7–4.0)
MCH: 30.3 pg (ref 26.0–34.0)
MCHC: 32 g/dL (ref 30.0–36.0)
MCV: 94.7 fL (ref 78.0–100.0)
Monocytes Absolute: 1.3 10*3/uL — ABNORMAL HIGH (ref 0.1–1.0)
Monocytes Relative: 7 %
NEUTROS ABS: 17.2 10*3/uL — AB (ref 1.7–7.7)
Neutrophils Relative %: 90 %
PLATELETS: 402 10*3/uL — AB (ref 150–400)
RBC: 4.19 MIL/uL (ref 3.87–5.11)
RDW: 13.6 % (ref 11.5–15.5)
WBC: 19 10*3/uL — ABNORMAL HIGH (ref 4.0–10.5)

## 2016-11-14 LAB — BASIC METABOLIC PANEL
ANION GAP: 7 (ref 5–15)
BUN: 18 mg/dL (ref 6–20)
CALCIUM: 8.9 mg/dL (ref 8.9–10.3)
CO2: 27 mmol/L (ref 22–32)
Chloride: 104 mmol/L (ref 101–111)
Creatinine, Ser: 0.69 mg/dL (ref 0.44–1.00)
GFR calc Af Amer: >60 mL/min (ref >60)
GLUCOSE: 166 mg/dL — AB (ref 65–99)
Potassium: 3.6 mmol/L (ref 3.5–5.1)
Sodium: 138 mmol/L (ref 135–145)

## 2016-11-14 MED ORDER — SODIUM CHLORIDE 0.9 % IV BOLUS (SEPSIS)
500.0000 mL | Freq: Once | INTRAVENOUS | Status: AC
  Administered 2016-11-14: 500 mL via INTRAVENOUS

## 2016-11-14 NOTE — ED Provider Notes (Signed)
[name] Provider Note   CSN: [id number] Arrival date & time: 11/14/16  2028     History   Chief Complaint Chief Complaint  Patient presents with  . Fall  . Hip Pain    HPI [name] is a 83 y.o. female.  She presents for evaluation of right hip injury, which occurred tonight, when she fell. Her husband suspects it was related to her chronic balance problem, and she was walking without her walker. She is supposed to always use her walker, but almost never does. No recent illnesses. She is taking her medications as described. Husband thinks that she has osteoporosis. There are no other known modifying factors.  HPI  Past Medical History:  Diagnosis Date  . Alzheimer's dementia   . Arthritis   . Cancer [facility])    breast CA  . Gout   . Hypothyroidism   . Incontinence of urine   . Rheumatoid arthritis(714.0)   . Short-term memory loss   . Thyroid disease   . Weakness of both legs     Patient Active Problem List   Diagnosis Date Noted  . Closed right hip fracture ([place]) 11/15/2016  . MGUS (monoclonal gammopathy of unknown significance) 01/21/2014  . History of breast cancer 01/21/2014  . Back pain 08/09/2013  . Severe protein-calorie malnutrition ([place]) 08/08/2013  . Palliative care encounter 08/08/2013  . Weakness generalized 08/08/2013  . Fall 08/07/2013  . Syncope 08/07/2013  . Dementia 08/07/2013  . Failure to thrive in adult 08/07/2013    Past Surgical History:  Procedure Laterality Date  . ABDOMINAL HYSTERECTOMY    . BREAST SURGERY  1974   mastectomy left  . lumbar fracture    . MASTECTOMY  1974   left breast    OB History    No data available       Home Medications    Prior to Admission medications   Medication Sig Start Date End Date Taking? Authorizing Provider  acetaminophen (TYLENOL) 500 MG tablet Take 500 mg by mouth every 6 (six) hours as needed.   Yes Historical Provider, MD  Calcium Carbonate-Vitamin D (CALCIUM + D PO)  Take 1 tablet by mouth daily.    Yes Historical Provider, MD  levothyroxine (SYNTHROID, LEVOTHROID) 50 MCG tablet Take 1.5 tablets (75 mcg total) by mouth daily before breakfast. 08/08/13  Yes Charlynne Cousins, MD  mirtazapine (REMERON) 15 MG tablet Take 1 tablet (15 mg total) by mouth at bedtime. Patient not taking: Reported on 11/14/2016 02/06/14   Heath Lark, MD    Family History Family History  Problem Relation Age of Onset  . Cancer Sister     unknown cancer    Social History Social History  Substance Use Topics  . Smoking status: Never Smoker  . Smokeless tobacco: Never Used  . Alcohol use No     Allergies   Patient has no known allergies.   Review of Systems Review of Systems  All other systems reviewed and are negative.    Physical Exam Updated Vital Signs BP 119/70 (BP Location: Right Arm)   Pulse (!) 121   Temp 98.3 F (36.8 C) (Oral) Comment: Simultaneous filing. User may not have seen previous data. Comment (Src): Simultaneous filing. User may not have seen previous data.  Resp 24   Ht 5' (1.524 m)   Wt 85 lb (38.6 kg)   SpO2 97%   BMI 16.60 kg/m   Physical Exam  Constitutional: She is oriented to person, place,  and time. She appears well-developed.  Elderly, frail  HENT:  Head: Normocephalic and atraumatic.  Eyes: Conjunctivae and EOM are normal. Pupils are equal, round, and reactive to light.  Neck: Normal range of motion and phonation normal. Neck supple.  Cardiovascular: Normal rate and regular rhythm.   Pulmonary/Chest: Effort normal and breath sounds normal. She exhibits no tenderness.  Abdominal: Soft. She exhibits no distension. There is no tenderness. There is no guarding.  Musculoskeletal:  Right leg shortened and externally rotated. Right hip is tender and she resists motion secondary to pain at the site.  Neurological: She is alert and oriented to person, place, and time. She exhibits normal muscle tone.  Skin: Skin is warm and dry.    Psychiatric: She has a normal mood and affect. Her behavior is normal. Judgment and thought content normal.  Nursing note and vitals reviewed.    ED Treatments / Results  Labs (all labs ordered are listed, but only abnormal results are displayed) Labs Reviewed  BASIC METABOLIC PANEL - Abnormal; Notable for the following:       Result Value   Glucose, Bld 166 (*)    All other components within normal limits  CBC WITH DIFFERENTIAL/PLATELET - Abnormal; Notable for the following:    WBC 19.0 (*)    Platelets 402 (*)    Neutro Abs 17.2 (*)    Lymphs Abs 0.6 (*)    Monocytes Absolute 1.3 (*)    All other components within normal limits    EKG  EKG Interpretation  Date/Time:  Wednesday November 15 2016 01:21:51 EST Ventricular Rate:  120 PR Interval:    QRS Duration: 66 QT Interval:  310 QTC Calculation: 438 R Axis:   77 Text Interpretation:  Sinus tachycardia Probable left atrial enlargement LVH with secondary repolarization abnormality since last tracing no significant change Confirmed by Eulis Foster  MD, Vira Agar [id number]) on 11/15/2016 1:24:57 AM       Radiology Dg Hip Unilat  With Pelvis 2-3 Views Right  Result Date: 11/14/2016 CLINICAL DATA:  83 y/o  F; right hip and wrist pain after fall. EXAM: DG HIP (WITH OR WITHOUT PELVIS) 2-3V RIGHT COMPARISON:  None. FINDINGS: Right femoral neck impacted and mildly displaced acute fracture with coxa vera deformity. The femoral head is well seated in the hip joint. No other fracture is identified. IMPRESSION: Right femoral neck impacted and mildly displaced acute fracture with coxa vera deformity. No hip dislocation. Electronically Signed   By: Kristine Garbe M.D.   On: 11/14/2016 22:47    Procedures Procedures (including critical care time)  Medications Ordered in ED Medications  diltiazem (CARDIZEM) injection 10 mg (not administered)  morphine 2 MG/ML injection 2 mg (not administered)  sodium chloride 0.9 % bolus 500 mL (500  mLs Intravenous New Bag/Given 11/14/16 2317)     Initial Impression / Assessment and Plan / ED Course  I have reviewed the triage vital signs and the nursing notes.  Pertinent labs & imaging results that were available during my care of the patient were reviewed by me and considered in my medical decision making (see chart for details).     Medications  diltiazem (CARDIZEM) injection 10 mg (not administered)  morphine 2 MG/ML injection 2 mg (not administered)  sodium chloride 0.9 % bolus 500 mL (500 mLs Intravenous New Bag/Given 11/14/16 2317)    Patient Vitals for the past 24 hrs:  BP Temp Temp src Pulse Resp SpO2 Height Weight  11/15/16 0115 119/70 - - Marland Kitchen)  121 24 97 % - -  11/15/16 0030 112/80 - - 112 19 98 % - -  11/15/16 0021 124/80 - - (!) 128 24 94 % - -  11/15/16 0000 124/80 - - 107 22 98 % - -  11/14/16 2345 - - - 116 23 98 % - -  11/14/16 2330 134/80 - - 111 22 98 % - -  11/14/16 2300 133/93 - - 114 23 97 % - -  11/14/16 2230 142/84 - - 112 21 100 % - -  11/14/16 2200 145/81 - - (!) 124 23 (!) 85 % - -  11/14/16 2152 128/75 - - 112 16 98 % - -  11/14/16 2130 128/75 - - 111 21 97 % - -  11/14/16 2100 133/91 - - 109 21 97 % - -  11/14/16 2034 - - - - - - 5' (1.524 m) 85 lb (38.6 kg)  11/14/16 2033 145/88 98.3 F (36.8 C) Oral 106 22 98 % - -   22:50- requested, page out to orthopedics.  10:54 PM Reevaluation with update and discussion. After initial assessment and treatment, an updated evaluation reveals Patient unchanged. Family updated on findings and plan.Daleen Bo L    Final Clinical Impressions(s) / ED Diagnoses   Final diagnoses:  Closed fracture of right hip, initial encounter ([place])   Isolated injury from fall, right hip fracture. Suspect mechanical cause for fall.  Nursing Notes Reviewed/ Care Coordinated, and agree without changes. Applicable Imaging Reviewed.  Interpretation of Laboratory Data incorporated into ED treatment  Plan: Admit to  hospitalist, consult surgery.  New Prescriptions New Prescriptions   No medications on file     Daleen Bo, MD 11/15/16 [phone]

## 2016-11-14 NOTE — ED Triage Notes (Signed)
Per EMS pt fell at home in her bathroom. Pt was found by her husband and the husband drug her out of the bathroom into the hallway. Pt complaining of right hip and right wrist pain. Pt has outward rotation of the right hip. Pt was given 56mcg of Fentanyl by EMS. Pt has Alzheimer's, per pt husband the pt is at her baseline. 148/92 prior to arrival p 104, 98% on 3L.

## 2016-11-14 NOTE — ED Notes (Signed)
Bed: [id number] Expected date:  Expected time:  Means of arrival:  Comments: Fall, no loc, hip deformity

## 2016-11-15 ENCOUNTER — Inpatient Hospital Stay (HOSPITAL_COMMUNITY): Payer: Medicare Other

## 2016-11-15 ENCOUNTER — Encounter (HOSPITAL_COMMUNITY)
Admission: EM | Disposition: A | Discharge status: Skilled Nursing Facility | Payer: Self-pay | Source: Home / Self Care | Attending: Family Medicine

## 2016-11-15 ENCOUNTER — Inpatient Hospital Stay (HOSPITAL_COMMUNITY): Payer: Medicare Other | Admitting: Anesthesiology

## 2016-11-15 DIAGNOSIS — E039 Hypothyroidism, unspecified: Secondary | ICD-10-CM | POA: Diagnosis present

## 2016-11-15 DIAGNOSIS — W1830XA Fall on same level, unspecified, initial encounter: Secondary | ICD-10-CM | POA: Diagnosis present

## 2016-11-15 DIAGNOSIS — Z9012 Acquired absence of left breast and nipple: Secondary | ICD-10-CM | POA: Diagnosis not present

## 2016-11-15 DIAGNOSIS — R52 Pain, unspecified: Secondary | ICD-10-CM

## 2016-11-15 DIAGNOSIS — G309 Alzheimer's disease, unspecified: Secondary | ICD-10-CM | POA: Diagnosis present

## 2016-11-15 DIAGNOSIS — F0280 Dementia in other diseases classified elsewhere without behavioral disturbance: Secondary | ICD-10-CM

## 2016-11-15 DIAGNOSIS — I480 Paroxysmal atrial fibrillation: Secondary | ICD-10-CM | POA: Diagnosis not present

## 2016-11-15 DIAGNOSIS — K5900 Constipation, unspecified: Secondary | ICD-10-CM | POA: Diagnosis present

## 2016-11-15 DIAGNOSIS — Y92002 Bathroom of unspecified non-institutional (private) residence single-family (private) house as the place of occurrence of the external cause: Secondary | ICD-10-CM | POA: Diagnosis not present

## 2016-11-15 DIAGNOSIS — I4891 Unspecified atrial fibrillation: Secondary | ICD-10-CM | POA: Diagnosis present

## 2016-11-15 DIAGNOSIS — S72001A Fracture of unspecified part of neck of right femur, initial encounter for closed fracture: Secondary | ICD-10-CM | POA: Diagnosis present

## 2016-11-15 DIAGNOSIS — R32 Unspecified urinary incontinence: Secondary | ICD-10-CM | POA: Diagnosis present

## 2016-11-15 DIAGNOSIS — M069 Rheumatoid arthritis, unspecified: Secondary | ICD-10-CM | POA: Diagnosis present

## 2016-11-15 DIAGNOSIS — Z853 Personal history of malignant neoplasm of breast: Secondary | ICD-10-CM | POA: Diagnosis not present

## 2016-11-15 DIAGNOSIS — Z90710 Acquired absence of both cervix and uterus: Secondary | ICD-10-CM | POA: Diagnosis not present

## 2016-11-15 DIAGNOSIS — M25551 Pain in right hip: Secondary | ICD-10-CM | POA: Diagnosis present

## 2016-11-15 HISTORY — PX: HIP ARTHROPLASTY: SHX981

## 2016-11-15 LAB — TSH: TSH: 0.507 u[IU]/mL (ref 0.350–4.500)

## 2016-11-15 LAB — PROTIME-INR
INR: 1.12
INR: 1.17
PROTHROMBIN TIME: 15 s (ref 11.4–15.2)
Prothrombin Time: 14.4 seconds (ref 11.4–15.2)

## 2016-11-15 LAB — TYPE AND SCREEN
ABO/RH(D): O POS
ANTIBODY SCREEN: NEGATIVE

## 2016-11-15 LAB — TROPONIN I: Troponin I: <0.03 ng/mL (ref <0.03)

## 2016-11-15 LAB — BRAIN NATRIURETIC PEPTIDE: B Natriuretic Peptide: 128.9 pg/mL — ABNORMAL HIGH (ref 0.0–100.0)

## 2016-11-15 LAB — ABO/RH: ABO/RH(D): O POS

## 2016-11-15 LAB — MRSA PCR SCREENING: MRSA by PCR: NEGATIVE

## 2016-11-15 LAB — APTT: aPTT: 29 seconds (ref 24–36)

## 2016-11-15 LAB — T4, FREE: FREE T4: 1.39 ng/dL — AB (ref 0.61–1.12)

## 2016-11-15 SURGERY — HEMIARTHROPLASTY, HIP, DIRECT ANTERIOR APPROACH, FOR FRACTURE
Anesthesia: General | Site: Hip | Laterality: Right

## 2016-11-15 MED ORDER — ASPIRIN EC 325 MG PO TBEC
325.0000 mg | DELAYED_RELEASE_TABLET | Freq: Every day | ORAL | Status: DC
  Administered 2016-11-16: 325 mg via ORAL
  Filled 2016-11-15: qty 1

## 2016-11-15 MED ORDER — PHENYLEPHRINE HCL 10 MG/ML IJ SOLN
INTRAMUSCULAR | Status: DC | PRN
  Administered 2016-11-15: 50 ug/min via INTRAVENOUS

## 2016-11-15 MED ORDER — PHENYLEPHRINE HCL 10 MG/ML IJ SOLN
INTRAMUSCULAR | Status: AC
  Filled 2016-11-15: qty 1

## 2016-11-15 MED ORDER — METOCLOPRAMIDE HCL 5 MG/ML IJ SOLN
5.0000 mg | Freq: Three times a day (TID) | INTRAMUSCULAR | Status: DC | PRN
Start: 2016-11-15 — End: 2016-11-17

## 2016-11-15 MED ORDER — SODIUM CHLORIDE 0.9 % IR SOLN
Status: DC | PRN
  Administered 2016-11-15: 1000 mL

## 2016-11-15 MED ORDER — PROPOFOL 10 MG/ML IV BOLUS
INTRAVENOUS | Status: DC | PRN
  Administered 2016-11-15: 60 mg via INTRAVENOUS

## 2016-11-15 MED ORDER — PROMETHAZINE HCL 25 MG/ML IJ SOLN: 6.2500 mg | INTRAMUSCULAR | Status: DC | PRN

## 2016-11-15 MED ORDER — CEFAZOLIN SODIUM-DEXTROSE 2-4 GM/100ML-% IV SOLN
2.0000 g | INTRAVENOUS | Status: AC
  Administered 2016-11-15: 2 g via INTRAVENOUS

## 2016-11-15 MED ORDER — MORPHINE SULFATE (PF) 2 MG/ML IV SOLN: 0.5000 mg | INTRAVENOUS | Status: DC | PRN

## 2016-11-15 MED ORDER — CEFAZOLIN SODIUM-DEXTROSE 2-4 GM/100ML-% IV SOLN
INTRAVENOUS | Status: AC
  Filled 2016-11-15: qty 100

## 2016-11-15 MED ORDER — MORPHINE SULFATE (PF) 2 MG/ML IV SOLN
2.0000 mg | INTRAVENOUS | Status: DC | PRN
  Administered 2016-11-15: 2 mg via INTRAVENOUS
  Filled 2016-11-15: qty 1

## 2016-11-15 MED ORDER — ADULT MULTIVITAMIN W/MINERALS CH
1.0000 | ORAL_TABLET | Freq: Every day | ORAL | Status: DC
  Administered 2016-11-16: 1 via ORAL
  Filled 2016-11-15: qty 1

## 2016-11-15 MED ORDER — ONDANSETRON HCL 4 MG/2ML IJ SOLN: 4.0000 mg | Freq: Four times a day (QID) | INTRAMUSCULAR | Status: DC | PRN

## 2016-11-15 MED ORDER — ACETAMINOPHEN 325 MG PO TABS: 650.0000 mg | ORAL_TABLET | Freq: Four times a day (QID) | ORAL | Status: DC | PRN

## 2016-11-15 MED ORDER — CEFAZOLIN IN D5W 1 GM/50ML IV SOLN
1.0000 g | Freq: Four times a day (QID) | INTRAVENOUS | Status: AC
  Administered 2016-11-16 (×2): 1 g via INTRAVENOUS
  Filled 2016-11-15 (×2): qty 50

## 2016-11-15 MED ORDER — SENNA 8.6 MG PO TABS
1.0000 | ORAL_TABLET | Freq: Two times a day (BID) | ORAL | Status: DC
  Administered 2016-11-15 – 2016-11-16 (×3): 8.6 mg via ORAL
  Filled 2016-11-15 (×4): qty 1

## 2016-11-15 MED ORDER — POVIDONE-IODINE 10 % EX SWAB: 2.0000 "application " | Freq: Once | CUTANEOUS | Status: DC

## 2016-11-15 MED ORDER — DILTIAZEM HCL 25 MG/5ML IV SOLN
10.0000 mg | Freq: Once | INTRAVENOUS | Status: AC
  Administered 2016-11-15: 10 mg via INTRAVENOUS
  Filled 2016-11-15: qty 5

## 2016-11-15 MED ORDER — SUGAMMADEX SODIUM 200 MG/2ML IV SOLN
INTRAVENOUS | Status: AC
  Filled 2016-11-15: qty 2

## 2016-11-15 MED ORDER — MENTHOL 3 MG MT LOZG: 1.0000 | LOZENGE | OROMUCOSAL | Status: DC | PRN

## 2016-11-15 MED ORDER — DEXAMETHASONE SODIUM PHOSPHATE 10 MG/ML IJ SOLN
INTRAMUSCULAR | Status: DC | PRN
  Administered 2016-11-15: 4 mg via INTRAVENOUS

## 2016-11-15 MED ORDER — ENOXAPARIN SODIUM 30 MG/0.3ML ~~LOC~~ SOLN
30.0000 mg | Freq: Every day | SUBCUTANEOUS | Status: DC
  Administered 2016-11-16: 30 mg via SUBCUTANEOUS
  Filled 2016-11-15: qty 0.3

## 2016-11-15 MED ORDER — FENTANYL CITRATE (PF) 100 MCG/2ML IJ SOLN
INTRAMUSCULAR | Status: DC | PRN
  Administered 2016-11-15 (×2): 25 ug via INTRAVENOUS
  Administered 2016-11-15: 50 ug via INTRAVENOUS

## 2016-11-15 MED ORDER — ACETAMINOPHEN 650 MG RE SUPP: 650.0000 mg | Freq: Four times a day (QID) | RECTAL | Status: DC | PRN

## 2016-11-15 MED ORDER — HYDROCODONE-ACETAMINOPHEN 5-325 MG PO TABS: 1.0000 | ORAL_TABLET | Freq: Four times a day (QID) | ORAL | Status: DC | PRN

## 2016-11-15 MED ORDER — METOPROLOL TARTRATE 25 MG PO TABS
12.5000 mg | ORAL_TABLET | Freq: Two times a day (BID) | ORAL | Status: DC
  Administered 2016-11-15 – 2016-11-16 (×3): 12.5 mg via ORAL
  Filled 2016-11-15 (×3): qty 1

## 2016-11-15 MED ORDER — ENOXAPARIN SODIUM 40 MG/0.4ML ~~LOC~~ SOLN
40.0000 mg | Freq: Every day | SUBCUTANEOUS | Status: DC
  Administered 2016-11-15: 40 mg via SUBCUTANEOUS
  Filled 2016-11-15: qty 0.4

## 2016-11-15 MED ORDER — MORPHINE SULFATE (PF) 4 MG/ML IV SOLN: 0.5000 mg | INTRAVENOUS | Status: DC | PRN

## 2016-11-15 MED ORDER — SODIUM CHLORIDE 0.9 % IV BOLUS (SEPSIS)
500.0000 mL | Freq: Once | INTRAVENOUS | Status: AC
  Administered 2016-11-15: 500 mL via INTRAVENOUS

## 2016-11-15 MED ORDER — DOCUSATE SODIUM 100 MG PO CAPS
100.0000 mg | ORAL_CAPSULE | Freq: Two times a day (BID) | ORAL | Status: DC
  Administered 2016-11-15 – 2016-11-16 (×2): 100 mg via ORAL
  Filled 2016-11-15 (×3): qty 1

## 2016-11-15 MED ORDER — ENSURE ENLIVE PO LIQD
237.0000 mL | ORAL | Status: DC
  Administered 2016-11-16: 237 mL via ORAL

## 2016-11-15 MED ORDER — LIDOCAINE 2% (20 MG/ML) 5 ML SYRINGE
INTRAMUSCULAR | Status: DC | PRN
  Administered 2016-11-15: 40 mg via INTRAVENOUS

## 2016-11-15 MED ORDER — PHENYLEPHRINE 40 MCG/ML (10ML) SYRINGE FOR IV PUSH (FOR BLOOD PRESSURE SUPPORT)
PREFILLED_SYRINGE | INTRAVENOUS | Status: AC
  Filled 2016-11-15: qty 10

## 2016-11-15 MED ORDER — ALBUMIN HUMAN 5 % IV SOLN
INTRAVENOUS | Status: DC | PRN
  Administered 2016-11-15: 19:00:00 via INTRAVENOUS

## 2016-11-15 MED ORDER — PROPOFOL 10 MG/ML IV BOLUS
INTRAVENOUS | Status: AC
  Filled 2016-11-15: qty 20

## 2016-11-15 MED ORDER — ORAL CARE MOUTH RINSE
15.0000 mL | Freq: Two times a day (BID) | OROMUCOSAL | Status: DC
  Administered 2016-11-15 – 2016-11-16 (×3): 15 mL via OROMUCOSAL

## 2016-11-15 MED ORDER — ALBUMIN HUMAN 5 % IV SOLN
INTRAVENOUS | Status: AC
Start: 2016-11-15 — End: 2016-11-15
  Filled 2016-11-15: qty 500

## 2016-11-15 MED ORDER — FENTANYL CITRATE (PF) 100 MCG/2ML IJ SOLN
INTRAMUSCULAR | Status: AC
  Filled 2016-11-15: qty 2

## 2016-11-15 MED ORDER — PHENOL 1.4 % MT LIQD: 1.0000 | OROMUCOSAL | Status: DC | PRN

## 2016-11-15 MED ORDER — LACTATED RINGERS IV SOLN
INTRAVENOUS | Status: DC | PRN
  Administered 2016-11-15 (×2): via INTRAVENOUS

## 2016-11-15 MED ORDER — LACTATED RINGERS IV SOLN: INTRAVENOUS | Status: DC

## 2016-11-15 MED ORDER — FENTANYL CITRATE (PF) 100 MCG/2ML IJ SOLN: 25.0000 ug | INTRAMUSCULAR | Status: DC | PRN

## 2016-11-15 MED ORDER — PHENYLEPHRINE HCL 10 MG/ML IJ SOLN
INTRAMUSCULAR | Status: DC | PRN
  Administered 2016-11-15: 120 ug via INTRAVENOUS
  Administered 2016-11-15 (×2): 80 ug via INTRAVENOUS
  Administered 2016-11-15: 120 ug via INTRAVENOUS

## 2016-11-15 MED ORDER — ONDANSETRON HCL 4 MG/2ML IJ SOLN
INTRAMUSCULAR | Status: AC
  Filled 2016-11-15: qty 2

## 2016-11-15 MED ORDER — DEXAMETHASONE SODIUM PHOSPHATE 10 MG/ML IJ SOLN
INTRAMUSCULAR | Status: AC
  Filled 2016-11-15: qty 1

## 2016-11-15 MED ORDER — SODIUM CHLORIDE 0.9 % IV SOLN
INTRAVENOUS | Status: DC
  Administered 2016-11-15 – 2016-11-16 (×2): via INTRAVENOUS

## 2016-11-15 MED ORDER — ONDANSETRON HCL 4 MG PO TABS: 4.0000 mg | ORAL_TABLET | Freq: Four times a day (QID) | ORAL | Status: DC | PRN

## 2016-11-15 MED ORDER — TRAMADOL HCL 50 MG PO TABS: 50.0000 mg | ORAL_TABLET | Freq: Four times a day (QID) | ORAL | Status: DC | PRN

## 2016-11-15 MED ORDER — CHLORHEXIDINE GLUCONATE 4 % EX LIQD
60.0000 mL | Freq: Once | CUTANEOUS | Status: AC
  Administered 2016-11-15: 4 via TOPICAL
  Filled 2016-11-15: qty 60

## 2016-11-15 MED ORDER — METOCLOPRAMIDE HCL 5 MG PO TABS: 5.0000 mg | ORAL_TABLET | Freq: Three times a day (TID) | ORAL | Status: DC | PRN

## 2016-11-15 MED ORDER — SUGAMMADEX SODIUM 200 MG/2ML IV SOLN
INTRAVENOUS | Status: DC | PRN
  Administered 2016-11-15: 80 mg via INTRAVENOUS

## 2016-11-15 MED ORDER — LEVOTHYROXINE SODIUM 75 MCG PO TABS
75.0000 ug | ORAL_TABLET | Freq: Every day | ORAL | Status: DC
  Administered 2016-11-15 – 2016-11-17 (×3): 75 ug via ORAL
  Filled 2016-11-15 (×3): qty 1

## 2016-11-15 MED ORDER — ONDANSETRON HCL 4 MG/2ML IJ SOLN
INTRAMUSCULAR | Status: DC | PRN
  Administered 2016-11-15: 4 mg via INTRAVENOUS

## 2016-11-15 MED ORDER — ALUM & MAG HYDROXIDE-SIMETH 200-200-20 MG/5ML PO SUSP: 30.0000 mL | ORAL | Status: DC | PRN

## 2016-11-15 MED ORDER — ROCURONIUM BROMIDE 50 MG/5ML IV SOSY
PREFILLED_SYRINGE | INTRAVENOUS | Status: DC | PRN
  Administered 2016-11-15: 30 mg via INTRAVENOUS

## 2016-11-15 SURGICAL SUPPLY — 50 items
ADH SKN CLS APL DERMABOND .7 (GAUZE/BANDAGES/DRESSINGS)
BAG SPEC THK2 15X12 ZIP CLS (MISCELLANEOUS)
BAG ZIPLOCK 12X15 (MISCELLANEOUS) ×1 IMPLANT
BLADE SAW SGTL 18X1.27X75 (BLADE) ×2 IMPLANT
BLADE SAW SGTL 18X1.27X75MM (BLADE) ×1
CAPT HIP HEMI 1 ×2 IMPLANT
CLOSURE WOUND 1/2 X4 (GAUZE/BANDAGES/DRESSINGS)
DERMABOND ADVANCED (GAUZE/BANDAGES/DRESSINGS)
DERMABOND ADVANCED .7 DNX12 (GAUZE/BANDAGES/DRESSINGS) ×1 IMPLANT
DRAPE INCISE IOBAN 85X60 (DRAPES) ×3 IMPLANT
DRAPE ORTHO SPLIT 77X108 STRL (DRAPES) ×6
DRAPE POUCH INSTRU U-SHP 10X18 (DRAPES) ×3 IMPLANT
DRAPE SURG ORHT 6 SPLT 77X108 (DRAPES) ×2 IMPLANT
DRAPE U-SHAPE 47X51 STRL (DRAPES) ×3 IMPLANT
DRESSING AQUACEL AG SP 3.5X10 (GAUZE/BANDAGES/DRESSINGS) IMPLANT
DRSG AQUACEL AG ADV 3.5X10 (GAUZE/BANDAGES/DRESSINGS) ×1 IMPLANT
DRSG AQUACEL AG SP 3.5X10 (GAUZE/BANDAGES/DRESSINGS) ×3
DRSG TEGADERM 4X4.75 (GAUZE/BANDAGES/DRESSINGS) ×1 IMPLANT
DURAPREP 26ML APPLICATOR (WOUND CARE) ×3 IMPLANT
ELECT BLADE TIP CTD 4 INCH (ELECTRODE) ×3 IMPLANT
ELECT REM PT RETURN 9FT ADLT (ELECTROSURGICAL) ×3
ELECTRODE REM PT RTRN 9FT ADLT (ELECTROSURGICAL) ×1 IMPLANT
EVACUATOR 1/8 PVC DRAIN (DRAIN) ×1 IMPLANT
FACESHIELD WRAPAROUND (MASK) ×6 IMPLANT
FACESHIELD WRAPAROUND OR TEAM (MASK) ×4 IMPLANT
GAUZE SPONGE 2X2 8PLY STRL LF (GAUZE/BANDAGES/DRESSINGS) ×1 IMPLANT
GLOVE BIOGEL PI IND STRL 7.5 (GLOVE) ×1 IMPLANT
GLOVE BIOGEL PI INDICATOR 7.5 (GLOVE) ×2
GLOVE ORTHO TXT STRL SZ7.5 (GLOVE) ×6 IMPLANT
GOWN STRL REUS W/TWL LRG LVL3 (GOWN DISPOSABLE) ×3 IMPLANT
GOWN STRL REUS W/TWL XL LVL3 (GOWN DISPOSABLE) ×6 IMPLANT
HANDPIECE INTERPULSE COAX TIP (DISPOSABLE)
IMMOBILIZER KNEE 20 (SOFTGOODS)
IMMOBILIZER KNEE 20 THIGH 36 (SOFTGOODS) IMPLANT
KIT BASIN OR (CUSTOM PROCEDURE TRAY) ×3 IMPLANT
LIQUID BAND (GAUZE/BANDAGES/DRESSINGS) ×2 IMPLANT
MANIFOLD NEPTUNE II (INSTRUMENTS) ×3 IMPLANT
PACK TOTAL JOINT (CUSTOM PROCEDURE TRAY) ×3 IMPLANT
POSITIONER SURGICAL ARM (MISCELLANEOUS) ×3 IMPLANT
SET HNDPC FAN SPRY TIP SCT (DISPOSABLE) IMPLANT
SPONGE GAUZE 2X2 STER 10/PKG (GAUZE/BANDAGES/DRESSINGS)
STRIP CLOSURE SKIN 1/2X4 (GAUZE/BANDAGES/DRESSINGS) ×2 IMPLANT
SUT ETHIBOND NAB CT1 #1 30IN (SUTURE) ×1 IMPLANT
SUT MNCRL AB 4-0 PS2 18 (SUTURE) ×3 IMPLANT
SUT VIC AB 1 CT1 36 (SUTURE) ×4 IMPLANT
SUT VIC AB 2-0 CT1 27 (SUTURE) ×3
SUT VIC AB 2-0 CT1 TAPERPNT 27 (SUTURE) ×2 IMPLANT
SUT VLOC 180 0 24IN GS25 (SUTURE) ×3 IMPLANT
TOWEL OR 17X26 10 PK STRL BLUE (TOWEL DISPOSABLE) ×6 IMPLANT
TRAY FOLEY W/METER SILVER 14FR (SET/KITS/TRAYS/PACK) IMPLANT

## 2016-11-15 NOTE — ED Notes (Signed)
ED Provider at bedside. 

## 2016-11-15 NOTE — Care Management Note (Signed)
Case Management Note  Patient Details  Name: [name] MRN: [id number] Date of Birth: 11-Jan-1927  Subjective/Objective:       sepsis             Action/Plan:Date:  November 14, 2016 Chart reviewed for concurrent status and case management needs. Will continue to follow patient progress. Discharge Planning: following for needs Expected discharge date: [id number] Lilybeth Vien, BSN, [place], [place]   Expected Discharge Date:   (unknown)               Expected Discharge Plan:  Home/Self Care  In-House Referral:     Discharge planning Services     Post Acute Care Choice:    Choice offered to:     DME Arranged:    DME Agency:     HH Arranged:    [place] Agency:     Status of Service:  In process, will continue to follow  If discussed at Long Length of Stay Meetings, dates discussed:    Additional Comments:  Leeroy Cha, RN 11/15/2016, 10:30 AM

## 2016-11-15 NOTE — Transfer of Care (Signed)
Immediate Anesthesia Transfer of Care Note  Patient: [name]  Procedure(s) Performed: Procedure(s): ARTHROPLASTY BIPOLAR HIP (HEMIARTHROPLASTY) (Right)  Patient Location: PACU  Anesthesia Type:General  Level of Consciousness: awake, alert  and oriented  Airway & Oxygen Therapy: Patient Spontanous Breathing and Patient connected to face mask oxygen  Post-op Assessment: Report given to RN and Post -op Vital signs reviewed and stable  Post vital signs: Reviewed and stable  Last Vitals:  Vitals:   11/15/16 1700 11/15/16 1800  BP:  (!) 151/71  Pulse: 89 95  Resp: (!) 22 (!) 32  Temp:      Last Pain:  Vitals:   11/15/16 1600  TempSrc: Oral  PainSc: 0-No pain      Patients Stated Pain Goal: 1 ([id number])  Complications: No apparent anesthesia complications

## 2016-11-15 NOTE — Interval H&P Note (Signed)
History and Physical Interval Note:  11/15/2016 6:53 PM  [name]  has presented today for surgery, with the diagnosis of right femoral neck fracture  The various methods of treatment have been discussed with the patient and family. After consideration of risks, benefits and other options for treatment, the patient has consented to  Procedure(s): ARTHROPLASTY BIPOLAR HIP (HEMIARTHROPLASTY) (Right) as a surgical intervention .  The patient's history has been reviewed, patient examined, no change in status, stable for surgery.  I have reviewed the patient's chart and labs.  Questions were answered to the patient's satisfaction.     Mauri Pole

## 2016-11-15 NOTE — Consult Note (Signed)
Reason for Consult:  Right femoral neck fracture  Referring Physician: ED Physician  ARMEDA Ford is an 83 y.o. female.  HPI:  [name] is an 83 year old female with a PMH of dementia, hypothyroidism, IgM kappa MGUS, and remote hx of breast cancer s/p L mastectomy and axillary lymph node dissection presenting with closed right femoral neck fracture following a mechanical fall.  Her husband suspects it was related to her chronic balance problem, and she was walking without her walker. She is supposed to always use her walker, but almost never does.  In the ED, she was noted to be in new A-fib with RVR but converted to sinus tach after IV diltiazem and morphine. Since her heart rate has improved  to NSR and pain has been controlled. Discussed the patient with the pt's husband and son.  Plan would be to do surgery once she is cleared.    Past Medical History:  Diagnosis Date  . Alzheimer's dementia   . Arthritis   . Cancer [facility])    breast CA  . Gout   . Hypothyroidism   . Incontinence of urine   . Rheumatoid arthritis(714.0)   . Short-term memory loss   . Thyroid disease   . Weakness of both legs     Past Surgical History:  Procedure Laterality Date  . ABDOMINAL HYSTERECTOMY    . BREAST SURGERY  1974   mastectomy left  . lumbar fracture    . MASTECTOMY  1974   left breast    Family History  Problem Relation Age of Onset  . Cancer Sister     unknown cancer    Social History:  reports that she has never smoked. She has never used smokeless tobacco. She reports that she does not drink alcohol or use drugs.  Allergies: No Known Allergies    Results for orders placed or performed during the hospital encounter of 11/14/16 (from the past 48 hour(s))  Basic metabolic panel     Status: Abnormal   Collection Time: 11/14/16 11:17 PM  Result Value Ref Range   Sodium 138 135 - 145 mmol/L   Potassium 3.6 3.5 - 5.1 mmol/L   Chloride 104 101 - 111 mmol/L   CO2 27 22 - 32 mmol/L   Glucose, Bld 166 (H) 65 - 99 mg/dL   BUN 18 6 - 20 mg/dL   Creatinine, Ser 0.69 0.44 - 1.00 mg/dL   Calcium 8.9 8.9 - 10.3 mg/dL   GFR calc non Af Amer >60 >60 mL/min   GFR calc Af Amer >60 >60 mL/min    Comment: (NOTE) The eGFR has been calculated using the CKD EPI equation. This calculation has not been validated in all clinical situations. eGFR's persistently <60 mL/min signify possible Chronic Kidney Disease.    Anion gap 7 5 - 15  CBC with Differential     Status: Abnormal   Collection Time: 11/14/16 11:17 PM  Result Value Ref Range   WBC 19.0 (H) 4.0 - 10.5 K/uL   RBC 4.19 3.87 - 5.11 MIL/uL   Hemoglobin 12.7 12.0 - 15.0 g/dL   HCT 39.7 36.0 - 46.0 %   MCV 94.7 78.0 - 100.0 fL   MCH 30.3 26.0 - 34.0 pg   MCHC 32.0 30.0 - 36.0 g/dL   RDW 13.6 11.5 - 15.5 %   Platelets 402 (H) 150 - 400 K/uL   Neutrophils Relative % 90 %   Neutro Abs 17.2 (H) 1.7 - 7.7 K/uL  Lymphocytes Relative 3 %   Lymphs Abs 0.6 (L) 0.7 - 4.0 K/uL   Monocytes Relative 7 %   Monocytes Absolute 1.3 (H) 0.1 - 1.0 K/uL   Eosinophils Relative 0 %   Eosinophils Absolute 0.0 0.0 - 0.7 K/uL   Basophils Relative 0 %   Basophils Absolute 0.0 0.0 - 0.1 K/uL  Troponin I (q 6hr x 3)     Status: None   Collection Time: 11/15/16  2:28 AM  Result Value Ref Range   Troponin I <0.03 <0.03 ng/mL  Protime-INR     Status: None   Collection Time: 11/15/16  2:28 AM  Result Value Ref Range   Prothrombin Time 14.4 11.4 - 15.2 seconds   INR 1.12   TSH     Status: None   Collection Time: 11/15/16  2:28 AM  Result Value Ref Range   TSH 0.507 0.350 - 4.500 uIU/mL    Comment: Performed by a 3rd Generation assay with a functional sensitivity of <=0.01 uIU/mL.  MRSA PCR Screening     Status: None   Collection Time: 11/15/16  5:10 AM  Result Value Ref Range   MRSA by PCR NEGATIVE NEGATIVE    Comment:        The GeneXpert MRSA Assay (FDA approved for NASAL specimens only), is one component of a comprehensive MRSA  colonization surveillance program. It is not intended to diagnose MRSA infection nor to guide or monitor treatment for MRSA infections.   Type and screen [place]     Status: None   Collection Time: 11/15/16  5:28 AM  Result Value Ref Range   ABO/RH(D) O POS    Antibody Screen NEG    Sample Expiration 11/18/2016   ABO/Rh     Status: None   Collection Time: 11/15/16  5:28 AM  Result Value Ref Range   ABO/RH(D) O POS   T4, free     Status: Abnormal   Collection Time: 11/15/16  5:30 AM  Result Value Ref Range   Free T4 1.39 (H) 0.61 - 1.12 ng/dL    Comment: (NOTE) Biotin ingestion may interfere with free T4 tests. If the results are inconsistent with the TSH level, previous test results, or the clinical presentation, then consider biotin interference. If needed, order repeat testing after stopping biotin. Performed at [facility], [place] [address], [place], [place] [address]   Brain natriuretic peptide     Status: Abnormal   Collection Time: 11/15/16  5:30 AM  Result Value Ref Range   B Natriuretic Peptide 128.9 (H) 0.0 - 100.0 pg/mL  Protime-INR     Status: None   Collection Time: 11/15/16  5:30 AM  Result Value Ref Range   Prothrombin Time 15.0 11.4 - 15.2 seconds   INR 1.17   APTT     Status: None   Collection Time: 11/15/16  5:30 AM  Result Value Ref Range   aPTT 29 24 - 36 seconds  Troponin I (q 6hr x 3)     Status: None   Collection Time: 11/15/16  8:32 AM  Result Value Ref Range   Troponin I <0.03 <0.03 ng/mL  Troponin I (q 6hr x 3)     Status: None   Collection Time: 11/15/16  2:00 PM  Result Value Ref Range   Troponin I <0.03 <0.03 ng/mL    Dg Chest 1 View  Result Date: 11/15/2016 CLINICAL DATA:  Posterior rib pain after a fall EXAM: CHEST 1  VIEW COMPARISON:  09/18/2015 FINDINGS: Hyperinflation is present. Surgical clips in the left axilla. Non inclusion of the left CP angle. Hazy atelectasis at the right base. Possible tiny right  effusion. No consolidation. Heart size within normal limits. Atherosclerosis. No pneumothorax. Possible old Hill-Sachs deformity of the right humeral head. IMPRESSION: 1. Hyperinflation. Right basilar atelectasis and possible small right effusion. No pneumothorax. Osteopenia limits evaluation for rib fracture. Electronically Signed   By: Donavan Foil M.D.   On: 11/15/2016 02:32   Dg Elbow 2 Views Right  Result Date: 11/15/2016 CLINICAL DATA:  Fall and diffuse arm pain EXAM: RIGHT ELBOW - 2 VIEW COMPARISON:  09/18/2015 FINDINGS: Bones appear osteopenic. No gross fracture or dislocation. No large elbow effusion. IMPRESSION: Osteopenia. No obvious acute osseous abnormality. Radiographic follow-up recommended if persistent clinical concern for elbow fracture Electronically Signed   By: Donavan Foil M.D.   On: 11/15/2016 02:24   Dg Wrist Complete Right  Result Date: 11/15/2016 CLINICAL DATA:  Fall with diffuse pain EXAM: RIGHT WRIST - COMPLETE 3+ VIEW COMPARISON:  None. FINDINGS: Diffuse osteopenia is present. There is deformity of the carpal bones with fusion across the radioulnar articulation. Multiple intercarpal fusion and probable fusion of the lunate to the radius and ulna. Irregularity and collapse of the scaphoid bone. No gross acute fracture. IMPRESSION: 1. No gross acute fracture identified. 2. Gross deformity at the carpal joint which may be secondary to remote trauma or advanced arthritis. Electronically Signed   By: Donavan Foil M.D.   On: 11/15/2016 02:28   Dg Shoulder Right Port  Result Date: 11/15/2016 CLINICAL DATA:  Status post fall with right posterior rib pain an diffuse arm pain. EXAM: PORTABLE RIGHT SHOULDER COMPARISON:  None. FINDINGS: No acute fracture or shoulder dislocation identified. The acromioclavicular interval is maintained. Bones are demineralized. IMPRESSION: Negative. Electronically Signed   By: Kristine Garbe M.D.   On: 11/15/2016 02:24   Dg Hip Unilat  With  Pelvis 2-3 Views Right  Result Date: 11/14/2016 CLINICAL DATA:  83 y/o  F; right hip and wrist pain after fall. EXAM: DG HIP (WITH OR WITHOUT PELVIS) 2-3V RIGHT COMPARISON:  None. FINDINGS: Right femoral neck impacted and mildly displaced acute fracture with coxa vera deformity. The femoral head is well seated in the hip joint. No other fracture is identified. IMPRESSION: Right femoral neck impacted and mildly displaced acute fracture with coxa vera deformity. No hip dislocation. Electronically Signed   By: Kristine Garbe M.D.   On: 11/14/2016 22:47    Review of Systems  Constitutional: Positive for malaise/fatigue.  HENT: Negative.   Eyes: Negative.   Respiratory: Negative.   Cardiovascular: Negative.   Gastrointestinal: Negative.   Genitourinary: Positive for frequency and urgency.  Musculoskeletal: Positive for joint pain.  Skin: Negative.   Neurological: Positive for weakness.  Endo/Heme/Allergies: Negative.   Psychiatric/Behavioral: Positive for memory loss.   Blood pressure 111/63, pulse 88, temperature 98.2 F (36.8 C), temperature source Oral, resp. rate (!) 24, height _0  (1.575 m), weight 38.6 kg (85 lb 1.6 oz), SpO2 94 %. Physical Exam  Constitutional: She is oriented to person, place, and time. She appears well-developed.  HENT:  Head: Normocephalic.  Eyes: Pupils are equal, round, and reactive to light.  Neck: Neck supple. No JVD present. No tracheal deviation present. No thyromegaly present.  Cardiovascular: Normal rate and intact distal pulses.   Respiratory: Effort normal and breath sounds normal. No respiratory distress. She has no wheezes.  GI: Soft. There is  no tenderness. There is no guarding.  Musculoskeletal:       Right hip: She exhibits decreased range of motion, decreased strength, tenderness and bony tenderness. She exhibits no swelling, no deformity and no laceration.  Lymphadenopathy:    She has no cervical adenopathy.  Neurological: She is  alert and oriented to person, place, and time.  Skin: Skin is warm and dry.  Psychiatric: She has a normal mood and affect.    Assessment/Plan: Right femoral neck fracture    Will wait for medical clearance.   Plan for right hip hemiarthroplasty when ready.  Discussed with husband and son, seem to understand that the best way to get the patient ambulatory again would to proceed with surgery.       [name] 11/15/2016, 3:13 PM

## 2016-11-15 NOTE — Anesthesia Procedure Notes (Signed)
Procedure Name: Intubation Date/Time: 11/15/2016 7:03 PM Performed by: West Pugh Pre-anesthesia Checklist: Patient identified, Emergency Drugs available, Suction available, Patient being monitored and Timeout performed Patient Re-evaluated:Patient Re-evaluated prior to inductionOxygen Delivery Method: Circle system utilized Preoxygenation: Pre-oxygenation with 100% oxygen Intubation Type: IV induction Ventilation: Mask ventilation without difficulty Laryngoscope Size: Glidescope and 3 Grade View: Grade I Tube type: Oral Tube size: 7.0 mm Number of attempts: 2 Airway Equipment and Method: Stylet Placement Confirmation: ETT inserted through vocal cords under direct vision,  positive ETCO2,  CO2 detector and breath sounds checked- equal and bilateral Secured at: 22 cm Tube secured with: Tape Dental Injury: Teeth and Oropharynx as per pre-operative assessment  Difficulty Due To: Difficult Airway- due to limited oral opening Comments: DL with Mac #3. Unable to visualize posterior pharynx d/t limited mouth opening. Glidescope #3 utilized with grade 1 view. Successfully intubated.

## 2016-11-15 NOTE — Progress Notes (Signed)
PROGRESS NOTE    [name]  [id number] DOB: [birth year] DOA: 11/14/2016 PCP: Mathews Argyle, MD Outpatient Specialists: Dr. Alvy Bimler (Oncology)   Brief Narrative:  [name] is an 83 year old female with a PMH of dementia, hypothyroidism, IgM kappa MGUS, and remote hx of breast cancer s/p L mastectomy and axillary lymph node dissection presenting with closed right femoral neck fracture following a mechanical fall. In the ED, she was noted to be in new A-fib with RVR but converted to sinus tach after IV diltiazem and morphine. Since her heart rate has improved  to NSR and pain has been controlled. Plan is for operative management 2/7.   Assessment & Plan:   Principal Problem:   Closed right hip fracture (HCC) Active Problems:   Dementia   History of breast cancer   Atrial fibrillation (HCC)  Closed right femoral neck fracture: Following what seems like a mechanical fall at home. Pain well-controlled. - Operative management per Dr. Alvan Dame, orthopedics.  - IV morphine and norco for pain - DVT ppx per orthopedics - Currently NPO - PT/OT after surgery  New onset A fib with RVR: Pt has no previous diagnosis of A-fib in the past. Resolved after IV cardizem. Now in sinus tachycardia. Trops negative x 1. TSH normal. - Cycle troponins - No indication for cardiology consult unless abnormal echo. - Continue Lopressor 12.5mg  bid - ECHO pending - f/u T4  Hypothyroidism: TSH normal - Continue home Synthroid - f/u T4  DVT prophylaxis: Lovenox Code Status: Full code until she is through surgery, then DNR Family Communication: Husband present at bedside, updated on plan. Disposition Plan: Transfer out of stepdown today. Will obtain PT/OT after surgery. She will likely go to SNF.  Consultants:   Orthopedic surgery (Dr. Alvan Dame)  Procedures:   None  Antimicrobials:   None  Subjective: Pt states she is doing well this morning. Her pain is well-controlled. She denies any chest  pain, shortness of breath, or palpitations.  Objective: Vitals:   11/15/16 0450 11/15/16 0500 11/15/16 0600 11/15/16 0728  BP:  140/87    Pulse:  (!) 107 99   Resp:  (!) 21 (!) 22   Temp: 98.7 F (37.1 C)   98 F (36.7 C)  TempSrc: Oral   Oral  SpO2:  94% 94%   Weight: 85 lb 1.6 oz (38.6 kg)     Height: 5\' 2"  (1.575 m)      No intake or output data in the 24 hours ending 11/15/16 0748 Filed Weights   11/14/16 2034 11/15/16 0450  Weight: 85 lb (38.6 kg) 85 lb 1.6 oz (38.6 kg)    Examination:  General exam: Appears calm and comfortable, pleasant Respiratory system: Clear to auscultation. Respiratory effort normal. Cardiovascular system: S1 & S2 heard, tachycardic, regular rhythm. No JVD, murmurs, rubs, gallops or clicks. No pedal edema. Gastrointestinal system: Abdomen is nondistended, soft and nontender. No organomegaly or masses felt. Normal bowel sounds heard. Central nervous system: Alert and oriented. No focal neurological deficits. Extremities: Right leg held in external rotation, normal tone, no ecchymosis appreciated. Compartment soft, sensorimotor intact distally. Brisk cap refill throughout.  Skin: No rashes, lesions or ulcers Psychiatry: Judgement and insight appear normal. Mood & affect appropriate.   Data Reviewed: I have personally reviewed following labs and imaging studies  CBC:  Recent Labs Lab 11/14/16 2317  WBC 19.0*  NEUTROABS 17.2*  HGB 12.7  HCT 39.7  MCV 94.7  PLT [id number]*   Basic Metabolic Panel:  Recent  Labs Lab 11/14/16 2317  NA 138  K 3.6  CL 104  CO2 27  GLUCOSE 166*  BUN 18  CREATININE 0.69  CALCIUM 8.9   GFR: Estimated Creatinine Clearance: 30.2 mL/min (by C-G formula based on SCr of 0.69 mg/dL). Liver Function Tests: No results for input(s): AST, ALT, ALKPHOS, BILITOT, PROT, ALBUMIN in the last 168 hours. No results for input(s): LIPASE, AMYLASE in the last 168 hours. No results for input(s): AMMONIA in the last 168  hours. Coagulation Profile:  Recent Labs Lab 11/15/16 0228 11/15/16 0530  INR 1.12 1.17   Cardiac Enzymes:  Recent Labs Lab 11/15/16 0228  TROPONINI <0.03   BNP (last 3 results) No results for input(s): PROBNP in the last 8760 hours. HbA1C: No results for input(s): HGBA1C in the last 72 hours. CBG: No results for input(s): GLUCAP in the last 168 hours. Lipid Profile: No results for input(s): CHOL, HDL, LDLCALC, TRIG, CHOLHDL, LDLDIRECT in the last 72 hours. Thyroid Function Tests:  Recent Labs  11/15/16 0228  TSH 0.507   Anemia Panel: No results for input(s): VITAMINB12, FOLATE, FERRITIN, TIBC, IRON, RETICCTPCT in the last 72 hours. Urine analysis:    Component Value Date/Time   COLORURINE YELLOW 09/18/2015 1149   APPEARANCEUR CLOUDY (A) 09/18/2015 1149   LABSPEC 1.010 09/18/2015 1149   PHURINE 7.5 09/18/2015 1149   GLUCOSEU NEGATIVE 09/18/2015 1149   HGBUR SMALL (A) 09/18/2015 1149   BILIRUBINUR NEGATIVE 09/18/2015 1149   KETONESUR NEGATIVE 09/18/2015 1149   PROTEINUR NEGATIVE 09/18/2015 1149   UROBILINOGEN 1.0 08/07/2013 0328   NITRITE NEGATIVE 09/18/2015 1149   LEUKOCYTESUR NEGATIVE 09/18/2015 1149   Sepsis Labs: @LABRCNTIP (procalcitonin:4,lacticidven:4)  ) Recent Results (from the past 240 hour(s))  MRSA PCR Screening     Status: None   Collection Time: 11/15/16  5:10 AM  Result Value Ref Range Status   MRSA by PCR NEGATIVE NEGATIVE Final    Comment:        The GeneXpert MRSA Assay (FDA approved for NASAL specimens only), is one component of a comprehensive MRSA colonization surveillance program. It is not intended to diagnose MRSA infection nor to guide or monitor treatment for MRSA infections.          Radiology Studies: Dg Chest 1 View  Result Date: 11/15/2016 CLINICAL DATA:  Posterior rib pain after a fall EXAM: CHEST 1 VIEW COMPARISON:  09/18/2015 FINDINGS: Hyperinflation is present. Surgical clips in the left axilla. Non inclusion  of the left CP angle. Hazy atelectasis at the right base. Possible tiny right effusion. No consolidation. Heart size within normal limits. Atherosclerosis. No pneumothorax. Possible old Hill-Sachs deformity of the right humeral head. IMPRESSION: 1. Hyperinflation. Right basilar atelectasis and possible small right effusion. No pneumothorax. Osteopenia limits evaluation for rib fracture. Electronically Signed   By: Donavan Foil M.D.   On: 11/15/2016 02:32   Dg Elbow 2 Views Right  Result Date: 11/15/2016 CLINICAL DATA:  Fall and diffuse arm pain EXAM: RIGHT ELBOW - 2 VIEW COMPARISON:  09/18/2015 FINDINGS: Bones appear osteopenic. No gross fracture or dislocation. No large elbow effusion. IMPRESSION: Osteopenia. No obvious acute osseous abnormality. Radiographic follow-up recommended if persistent clinical concern for elbow fracture Electronically Signed   By: Donavan Foil M.D.   On: 11/15/2016 02:24   Dg Wrist Complete Right  Result Date: 11/15/2016 CLINICAL DATA:  Fall with diffuse pain EXAM: RIGHT WRIST - COMPLETE 3+ VIEW COMPARISON:  None. FINDINGS: Diffuse osteopenia is present. There is deformity of the carpal bones  with fusion across the radioulnar articulation. Multiple intercarpal fusion and probable fusion of the lunate to the radius and ulna. Irregularity and collapse of the scaphoid bone. No gross acute fracture. IMPRESSION: 1. No gross acute fracture identified. 2. Gross deformity at the carpal joint which may be secondary to remote trauma or advanced arthritis. Electronically Signed   By: Donavan Foil M.D.   On: 11/15/2016 02:28   Dg Shoulder Right Port  Result Date: 11/15/2016 CLINICAL DATA:  Status post fall with right posterior rib pain an diffuse arm pain. EXAM: PORTABLE RIGHT SHOULDER COMPARISON:  None. FINDINGS: No acute fracture or shoulder dislocation identified. The acromioclavicular interval is maintained. Bones are demineralized. IMPRESSION: Negative. Electronically Signed   By:  Kristine Garbe M.D.   On: 11/15/2016 02:24   Dg Hip Unilat  With Pelvis 2-3 Views Right  Result Date: 11/14/2016 CLINICAL DATA:  83 y/o  F; right hip and wrist pain after fall. EXAM: DG HIP (WITH OR WITHOUT PELVIS) 2-3V RIGHT COMPARISON:  None. FINDINGS: Right femoral neck impacted and mildly displaced acute fracture with coxa vera deformity. The femoral head is well seated in the hip joint. No other fracture is identified. IMPRESSION: Right femoral neck impacted and mildly displaced acute fracture with coxa vera deformity. No hip dislocation. Electronically Signed   By: Kristine Garbe M.D.   On: 11/14/2016 22:47        Scheduled Meds: . enoxaparin (LOVENOX) injection  40 mg Subcutaneous Daily  . levothyroxine  75 mcg Oral QAC breakfast  . mouth rinse  15 mL Mouth Rinse BID  . metoprolol tartrate  12.5 mg Oral BID  . senna  1 tablet Oral BID   Continuous Infusions:   LOS: 0 days     Evette Doffing, MD [facility] Medicine Resident PGY-2 Pager # [phone]  Attending Dr. Bonner Puna Pager # [phone]   If 7PM-7AM, please contact night-coverage [email or web address] Password [facility] 11/15/2016, 7:48 AM

## 2016-11-15 NOTE — H&P (View-Only) (Signed)
Reason for Consult:  Right femoral neck fracture  Referring Physician: ED Physician  [name] is an 83 y.o. female.  HPI:  [name] is an 83 year old female with a PMH of dementia, hypothyroidism, IgM kappa MGUS, and remote hx of breast cancer s/p L mastectomy and axillary lymph node dissection presenting with closed right femoral neck fracture following a mechanical fall.  Her husband suspects it was related to her chronic balance problem, and she was walking without her walker. She is supposed to always use her walker, but almost never does.  In the ED, she was noted to be in new A-fib with RVR but converted to sinus tach after IV diltiazem and morphine. Since her heart rate has improved  to NSR and pain has been controlled. Discussed the patient with the pt's husband and son.  Plan would be to do surgery once she is cleared.    Past Medical History:  Diagnosis Date  . Alzheimer's dementia   . Arthritis   . Cancer [facility])    breast CA  . Gout   . Hypothyroidism   . Incontinence of urine   . Rheumatoid arthritis(714.0)   . Short-term memory loss   . Thyroid disease   . Weakness of both legs     Past Surgical History:  Procedure Laterality Date  . ABDOMINAL HYSTERECTOMY    . BREAST SURGERY  1974   mastectomy left  . lumbar fracture    . MASTECTOMY  1974   left breast    Family History  Problem Relation Age of Onset  . Cancer Sister     unknown cancer    Social History:  reports that she has never smoked. She has never used smokeless tobacco. She reports that she does not drink alcohol or use drugs.  Allergies: No Known Allergies    Results for orders placed or performed during the hospital encounter of 11/14/16 (from the past 48 hour(s))  Basic metabolic panel     Status: Abnormal   Collection Time: 11/14/16 11:17 PM  Result Value Ref Range   Sodium 138 135 - 145 mmol/L   Potassium 3.6 3.5 - 5.1 mmol/L   Chloride 104 101 - 111 mmol/L   CO2 27 22 - 32 mmol/L   Glucose, Bld 166 (H) 65 - 99 mg/dL   BUN 18 6 - 20 mg/dL   Creatinine, Ser 0.69 0.44 - 1.00 mg/dL   Calcium 8.9 8.9 - 10.3 mg/dL   GFR calc non Af Amer >60 >60 mL/min   GFR calc Af Amer >60 >60 mL/min    Comment: (NOTE) The eGFR has been calculated using the CKD EPI equation. This calculation has not been validated in all clinical situations. eGFR's persistently <60 mL/min signify possible Chronic Kidney Disease.    Anion gap 7 5 - 15  CBC with Differential     Status: Abnormal   Collection Time: 11/14/16 11:17 PM  Result Value Ref Range   WBC 19.0 (H) 4.0 - 10.5 K/uL   RBC 4.19 3.87 - 5.11 MIL/uL   Hemoglobin 12.7 12.0 - 15.0 g/dL   HCT 39.7 36.0 - 46.0 %   MCV 94.7 78.0 - 100.0 fL   MCH 30.3 26.0 - 34.0 pg   MCHC 32.0 30.0 - 36.0 g/dL   RDW 13.6 11.5 - 15.5 %   Platelets 402 (H) 150 - 400 K/uL   Neutrophils Relative % 90 %   Neutro Abs 17.2 (H) 1.7 - 7.7 K/uL  Lymphocytes Relative 3 %   Lymphs Abs 0.6 (L) 0.7 - 4.0 K/uL   Monocytes Relative 7 %   Monocytes Absolute 1.3 (H) 0.1 - 1.0 K/uL   Eosinophils Relative 0 %   Eosinophils Absolute 0.0 0.0 - 0.7 K/uL   Basophils Relative 0 %   Basophils Absolute 0.0 0.0 - 0.1 K/uL  Troponin I (q 6hr x 3)     Status: None   Collection Time: 11/15/16  2:28 AM  Result Value Ref Range   Troponin I <0.03 <0.03 ng/mL  Protime-INR     Status: None   Collection Time: 11/15/16  2:28 AM  Result Value Ref Range   Prothrombin Time 14.4 11.4 - 15.2 seconds   INR 1.12   TSH     Status: None   Collection Time: 11/15/16  2:28 AM  Result Value Ref Range   TSH 0.507 0.350 - 4.500 uIU/mL    Comment: Performed by a 3rd Generation assay with a functional sensitivity of <=0.01 uIU/mL.  MRSA PCR Screening     Status: None   Collection Time: 11/15/16  5:10 AM  Result Value Ref Range   MRSA by PCR NEGATIVE NEGATIVE    Comment:        The GeneXpert MRSA Assay (FDA approved for NASAL specimens only), is one component of a comprehensive MRSA  colonization surveillance program. It is not intended to diagnose MRSA infection nor to guide or monitor treatment for MRSA infections.   Type and screen [place]     Status: None   Collection Time: 11/15/16  5:28 AM  Result Value Ref Range   ABO/RH(D) O POS    Antibody Screen NEG    Sample Expiration 11/18/2016   ABO/Rh     Status: None   Collection Time: 11/15/16  5:28 AM  Result Value Ref Range   ABO/RH(D) O POS   T4, free     Status: Abnormal   Collection Time: 11/15/16  5:30 AM  Result Value Ref Range   Free T4 1.39 (H) 0.61 - 1.12 ng/dL    Comment: (NOTE) Biotin ingestion may interfere with free T4 tests. If the results are inconsistent with the TSH level, previous test results, or the clinical presentation, then consider biotin interference. If needed, order repeat testing after stopping biotin. Performed at [facility], [place] [address] [place], [place] [address]   Brain natriuretic peptide     Status: Abnormal   Collection Time: 11/15/16  5:30 AM  Result Value Ref Range   B Natriuretic Peptide 128.9 (H) 0.0 - 100.0 pg/mL  Protime-INR     Status: None   Collection Time: 11/15/16  5:30 AM  Result Value Ref Range   Prothrombin Time 15.0 11.4 - 15.2 seconds   INR 1.17   APTT     Status: None   Collection Time: 11/15/16  5:30 AM  Result Value Ref Range   aPTT 29 24 - 36 seconds  Troponin I (q 6hr x 3)     Status: None   Collection Time: 11/15/16  8:32 AM  Result Value Ref Range   Troponin I <0.03 <0.03 ng/mL  Troponin I (q 6hr x 3)     Status: None   Collection Time: 11/15/16  2:00 PM  Result Value Ref Range   Troponin I <0.03 <0.03 ng/mL    Dg Chest 1 View  Result Date: 11/15/2016 CLINICAL DATA:  Posterior rib pain after a fall EXAM: CHEST 1  VIEW COMPARISON:  09/18/2015 FINDINGS: Hyperinflation is present. Surgical clips in the left axilla. Non inclusion of the left CP angle. Hazy atelectasis at the right base. Possible tiny right  effusion. No consolidation. Heart size within normal limits. Atherosclerosis. No pneumothorax. Possible old Hill-Sachs deformity of the right humeral head. IMPRESSION: 1. Hyperinflation. Right basilar atelectasis and possible small right effusion. No pneumothorax. Osteopenia limits evaluation for rib fracture. Electronically Signed   By: Donavan Foil M.D.   On: 11/15/2016 02:32   Dg Elbow 2 Views Right  Result Date: 11/15/2016 CLINICAL DATA:  Fall and diffuse arm pain EXAM: RIGHT ELBOW - 2 VIEW COMPARISON:  09/18/2015 FINDINGS: Bones appear osteopenic. No gross fracture or dislocation. No large elbow effusion. IMPRESSION: Osteopenia. No obvious acute osseous abnormality. Radiographic follow-up recommended if persistent clinical concern for elbow fracture Electronically Signed   By: Donavan Foil M.D.   On: 11/15/2016 02:24   Dg Wrist Complete Right  Result Date: 11/15/2016 CLINICAL DATA:  Fall with diffuse pain EXAM: RIGHT WRIST - COMPLETE 3+ VIEW COMPARISON:  None. FINDINGS: Diffuse osteopenia is present. There is deformity of the carpal bones with fusion across the radioulnar articulation. Multiple intercarpal fusion and probable fusion of the lunate to the radius and ulna. Irregularity and collapse of the scaphoid bone. No gross acute fracture. IMPRESSION: 1. No gross acute fracture identified. 2. Gross deformity at the carpal joint which may be secondary to remote trauma or advanced arthritis. Electronically Signed   By: Donavan Foil M.D.   On: 11/15/2016 02:28   Dg Shoulder Right Port  Result Date: 11/15/2016 CLINICAL DATA:  Status post fall with right posterior rib pain an diffuse arm pain. EXAM: PORTABLE RIGHT SHOULDER COMPARISON:  None. FINDINGS: No acute fracture or shoulder dislocation identified. The acromioclavicular interval is maintained. Bones are demineralized. IMPRESSION: Negative. Electronically Signed   By: Kristine Garbe M.D.   On: 11/15/2016 02:24   Dg Hip Unilat  With  Pelvis 2-3 Views Right  Result Date: 11/14/2016 CLINICAL DATA:  83 y/o  F; right hip and wrist pain after fall. EXAM: DG HIP (WITH OR WITHOUT PELVIS) 2-3V RIGHT COMPARISON:  None. FINDINGS: Right femoral neck impacted and mildly displaced acute fracture with coxa vera deformity. The femoral head is well seated in the hip joint. No other fracture is identified. IMPRESSION: Right femoral neck impacted and mildly displaced acute fracture with coxa vera deformity. No hip dislocation. Electronically Signed   By: Kristine Garbe M.D.   On: 11/14/2016 22:47    Review of Systems  Constitutional: Positive for malaise/fatigue.  HENT: Negative.   Eyes: Negative.   Respiratory: Negative.   Cardiovascular: Negative.   Gastrointestinal: Negative.   Genitourinary: Positive for frequency and urgency.  Musculoskeletal: Positive for joint pain.  Skin: Negative.   Neurological: Positive for weakness.  Endo/Heme/Allergies: Negative.   Psychiatric/Behavioral: Positive for memory loss.   Blood pressure 111/63, pulse 88, temperature 98.2 F (36.8 C), temperature source Oral, resp. rate (!) 24, height _0  (1.575 m), weight 38.6 kg (85 lb 1.6 oz), SpO2 94 %. Physical Exam  Constitutional: She is oriented to person, place, and time. She appears well-developed.  HENT:  Head: Normocephalic.  Eyes: Pupils are equal, round, and reactive to light.  Neck: Neck supple. No JVD present. No tracheal deviation present. No thyromegaly present.  Cardiovascular: Normal rate and intact distal pulses.   Respiratory: Effort normal and breath sounds normal. No respiratory distress. She has no wheezes.  GI: Soft. There is  no tenderness. There is no guarding.  Musculoskeletal:       Right hip: She exhibits decreased range of motion, decreased strength, tenderness and bony tenderness. She exhibits no swelling, no deformity and no laceration.  Lymphadenopathy:    She has no cervical adenopathy.  Neurological: She is  alert and oriented to person, place, and time.  Skin: Skin is warm and dry.  Psychiatric: She has a normal mood and affect.    Assessment/Plan: Right femoral neck fracture    Will wait for medical clearance.   Plan for right hip hemiarthroplasty when ready.  Discussed with husband and son, seem to understand that the best way to get the patient ambulatory again would to proceed with surgery.       [name] 11/15/2016, 3:13 PM

## 2016-11-15 NOTE — Progress Notes (Signed)
Initial Nutrition Assessment  DOCUMENTATION CODES:   Severe malnutrition in context of chronic illness, Underweight  INTERVENTION:  - Will order Ensure Enlive once/day for when diet advancement to at least FLD, this supplement provides 350 kcal and 20 grams of protein. - Will order daily multivitamin with minerals.  NUTRITION DIAGNOSIS:   Malnutrition related to chronic illness as evidenced by severe depletion of muscle mass, severe depletion of body fat.  GOAL:   Patient will meet greater than or equal to 90% of their needs  MONITOR:   Diet advancement, PO intake, Supplement acceptance, Weight trends, Labs, I & O's  REASON FOR ASSESSMENT:   Consult Hip fracture protocol  ASSESSMENT:   83 y.o. woman with a history of breast cancer, dementia, RA, and hypothyroidism who was in her baseline state of health today when she sustained a mechanical fall at home.  The fall was unwitnessed (she was in the restroom), but her husband heard her fall and responded promptly.  No LOC.  No stigmata of seizure activity.  He called 911 promptly.  Pt seen per protocol. BMI indicates underweight status. Pt ordered CLD but RN reports verbal report to mainly only provide items with medications d/t plan for surgery of R hip today at 1800. No family/visitors present at this time. Pt with hx of dementia but is able to provide reliable answers to the questions asked by RD. She denies any chewing or swallowing difficulties. She states that she has never been a big eater and that she mainly eats a small meal for breakfast and dinner and then has a snack around lunch-time. Asked pt about foods she typically eats but she is unsure of how to answer this question, even when stated in different ways.   Pt hesitant to allow RD to perform physical assessment so not all areas were examined. Based on the areas that were examined, there was moderate and severe muscle and moderate and severe fat wasting. Pt denies any  recent weight changes. No previous weight hx since 02/06/2014; only -0.1 kg change in weight since this time.  Medications reviewed; 75 mcg oral Synthroid/day.  Labs reviewed.   Diet Order:  Diet clear liquid Room service appropriate? Yes; Fluid consistency: Thin  Skin:  Reviewed, no issues  Last BM:  PTA/unknown  Height:   Ht Readings from Last 1 Encounters:  11/15/16 5\' 2"  (1.575 m)    Weight:   Wt Readings from Last 1 Encounters:  11/15/16 85 lb 1.6 oz (38.6 kg)    Ideal Body Weight:  50 kg  BMI:  Body mass index is 15.56 kg/m.  Estimated Nutritional Needs:   Kcal:  1080-1235 (28-32 kcal/kg)  Protein:  46-54 grams (1.2-1.4 grams/kg)  Fluid:  >/= 1.3 L/day  EDUCATION NEEDS:   No education needs identified at this time    Jarome Matin, MS, RD, LDN, CNSC Inpatient Clinical Dietitian Pager # [phone] After hours/weekend pager # [phone]

## 2016-11-15 NOTE — Anesthesia Preprocedure Evaluation (Addendum)
Anesthesia Evaluation  Patient identified by MRN, date of birth, ID band Patient awake    Reviewed: Allergy & Precautions, NPO status , Patient's Chart, lab work & pertinent test results  Airway Mallampati: II  TM Distance: >3 FB Neck ROM: Full    Dental no notable dental hx.    Pulmonary neg pulmonary ROS,    Pulmonary exam normal breath sounds clear to auscultation       Cardiovascular Normal cardiovascular exam+ dysrhythmias Atrial Fibrillation  Rhythm:Regular Rate:Normal     Neuro/Psych dementia negative psych ROS   GI/Hepatic negative GI ROS, Neg liver ROS,   Endo/Other  Hypothyroidism   Renal/GU negative Renal ROS  negative genitourinary   Musculoskeletal  (+) Arthritis , Rheumatoid disorders,    Abdominal   Peds negative pediatric ROS (+)  Hematology negative hematology ROS (+)   Anesthesia Other Findings   Reproductive/Obstetrics negative OB ROS                            Anesthesia Physical Anesthesia Plan  ASA: III  Anesthesia Plan: General   Post-op Pain Management:    Induction: Intravenous  Airway Management Planned: Oral ETT  Additional Equipment:   Intra-op Plan:   Post-operative Plan: Extubation in OR  Informed Consent: I have reviewed the patients History and Physical, chart, labs and discussed the procedure including the risks, benefits and alternatives for the proposed anesthesia with the patient or authorized representative who has indicated his/her understanding and acceptance.   Dental advisory given  Plan Discussed with: CRNA and Surgeon  Anesthesia Plan Comments:         Anesthesia Quick Evaluation

## 2016-11-15 NOTE — ED Notes (Addendum)
Unable to get urine sample, because pt just went in diaper.

## 2016-11-15 NOTE — Op Note (Signed)
NAME:  [name], [name]                ACCOUNT NO.:  [id number]   MEDICAL RECORD NO.: [id number]   LOCATION:  [id number]                         FACILITY:  [facility]   DATE OF BIRTH:  [birth year]  PHYSICIAN:  Pietro Cassis. Alvan Dame, M.D.     DATE OF PROCEDURE:  11/15/16                               OPERATIVE REPORT     PREOPERATIVE DIAGNOSIS:  Right displaced femoral neck fracture.   POSTOPERATIVE DIAGNOSIS:  Right displaced femoral neck fracture.   PROCEDURE:  Right hip hemiarthroplasty utilizing DePuy component, size 4 standard press fit Summit Basic stem with a 12mm unipolar ball with a +5 adapter.   SURGEON:  Pietro Cassis. Alvan Dame, MD   ASSISTANT:  None   ANESTHESIA:  General.   SPECIMENS:  None.   DRAINS:  None   BLOOD LOSS:  About 100 cc.   COMPLICATIONS:  None.   INDICATION OF PROCEDURE:  [name] is a 83 year old female who lives at home with spouse independently.  She unfortunately had a fall at her house leaving her bathroom.  She was admitted to the hospital after radiographs revealed a femoral neck fracture.  She was seen and evaluated and was scheduled for surgery for fixation.  The necessity of surgical repair was discussed with she and her family.  Consent was obtained after reviewing risks of infection, DVT, component failure, and need for revision surgery.   PROCEDURE IN DETAIL:  The patient was brought to the operative theater. Once adequate anesthesia, preoperative antibiotics, 2 g of Ancef administered, the patient was positioned into the left lateral decubitus position with the right side up.  The right lower extremity was then prepped and draped in sterile fashion.  A time-out was performed identifying the patient, planned procedure, and extremity.   A lateral incision was made off the proximal trochanter. Sharp dissection was carried down to the iliotibial band and gluteal fascia. The gluteal fascia was then incised for posterior approach.  The short external rotators were  taken down separate from the posterior capsule. An L capsulotomy was made preserving the posterior leaflet for later anatomic repair. Fracture site was identified and after removing comminuted segments of the posterior femoral neck, the femoral head was removed without difficulty and measured on the back table  using the sizing rings and determined to be 43 mm in diameter.   The proximal femur was then exposed.  Retractors placed.  I then drilled, opened the proximal femur.  Then I hand reamed once and  Irrigated the canal to try to prevent fat emboli.  I began broaching the femur with a starting broach up to a size 4 broach with good medial and lateral metaphyseal fit without evidence of any torsion or movement.  A trial reduction was carried out with a standard offset neck and a +0 then +5 adapter with a 36mm ball.  The hip reduced nicely.  The leg lengths appeared to be equal compared to the down leg.   The hip went through a range of motion without evidence of any subluxation or impingement.   Given these findings, the trial components removed.  The final 4  Standard Summit [employer] Fit stem  was opened.  After irrigating the canal, the final stem was impacted and sat at the level where the broach was. Based on this and the trial reduction, a +5 adapter was opened and impacted in the 67mm unipolar ball onto a clean and dry trunnion.  The hip had been irrigated throughout the case and again at this point.  I re- Approximated the posterior capsule to the superior leaflet using a  #1 Vicryl.  The remainder of the wound was closed with #1 Vicryl and #0 V-lock sutures in the iliotibial band and gluteal fascia, a  2-0 Vicryl in the sub-Q tissue and a running 4-0 Monocryl in the skin.  The hip was cleaned, dried, and dressed sterilely using Dermabond and Aquacel dressing.  She was then brought to recovery room, extubated in stable condition, tolerating the procedure well.          Pietro Cassis Alvan Dame, M.D.

## 2016-11-15 NOTE — Progress Notes (Signed)
Chaquana Leduff, patients son phone number [phone]

## 2016-11-15 NOTE — H&P (Addendum)
History and Physical    [name] [id number] DOB: 15-Jun-1927 DOA: 11/14/2016  PCP: Mathews Argyle, MD   Patient coming from: Home  Chief Complaint: Fall, right hip deformity with pain  HPI: [name] is a 83 y.o. woman with a history of breast cancer, dementia, RA, and hypothyroidism who was in her baseline state of health today when she sustained a mechanical fall at home.  The fall was unwitnessed (she was in the restroom), but her husband heard her fall and responded promptly.  No LOC.  No stigmata of seizure activity.  He called 911 promptly.  The patient received IV fentanyl en route.  No chest pain, shortness of breath, palpitations, or swelling.  No nausea of vomiting.  No known history of heart disease (though her husband is followed by Dr. Rayann Heman and Dr. Marlou Porch).  No recent antibiotics or hospitalizations.  No dysuria but urine has had a strong odor per her husband.  She wears a depends at baseline for urinary incontinence.  ED Course: Xray of right hip shows right femoral neck impacted and mildly displaced fracture with coxa vera deformity.  No apparent fractures to her right shoulder, elbow, or wrist.  Patient referred for admission.  Upon my assessment at the bedside, the patient was found to be in intermittent atrial fibrillation with RVR (heart rates as high as 150's).  STAT EKG only captured sinus tachycardia, but I watched her rhythm change multiple times while I was at the bedside.  She patient denied palpitations, chest pain, or shortness of breath.  She admitted to moderate to severe pain (6-7 out of 10).  The patient received 10mg  of IV cardizem and 2mg  of IV morphine at the time of my assessment.  Bed request changed to stepdown unit.  Review of Systems: As per HPI otherwise 10 point review of systems negative.    Past Medical History:  Diagnosis Date  . Alzheimer's dementia   . Arthritis   . Cancer [facility])    breast CA  . Gout   . Hypothyroidism    . Incontinence of urine   . Rheumatoid arthritis(714.0)   . Short-term memory loss   . Thyroid disease   . Weakness of both legs     Past Surgical History:  Procedure Laterality Date  . ABDOMINAL HYSTERECTOMY    . BREAST SURGERY  1974   mastectomy left  . lumbar fracture    . MASTECTOMY  1974   left breast     reports that she has never smoked. She has never used smokeless tobacco. She reports that she does not drink alcohol or use drugs.  No Known Allergies  Family History  Problem Relation Age of Onset  . Cancer Sister     unknown cancer     Prior to Admission medications   Medication Sig Start Date End Date Taking? Authorizing Provider  acetaminophen (TYLENOL) 500 MG tablet Take 500 mg by mouth every 6 (six) hours as needed.   Yes Historical Provider, MD  Calcium Carbonate-Vitamin D (CALCIUM + D PO) Take 1 tablet by mouth daily.    Yes Historical Provider, MD  levothyroxine (SYNTHROID, LEVOTHROID) 50 MCG tablet Take 1.5 tablets (75 mcg total) by mouth daily before breakfast. 08/08/13  Yes Charlynne Cousins, MD    Physical Exam: Vitals:   11/15/16 0115 11/15/16 0130 11/15/16 0230 11/15/16 0236  BP: 119/70 135/81 122/75 122/75  Pulse: (!) 121   110  Resp: 24 25 20 20   Temp:  TempSrc:      SpO2: 97%   97%  Weight:      Height:          Constitutional: NAD, calm, comfortable, frail appearing Vitals:   11/15/16 0115 11/15/16 0130 11/15/16 0230 11/15/16 0236  BP: 119/70 135/81 122/75 122/75  Pulse: (!) 121   110  Resp: 24 25 20 20   Temp:      TempSrc:      SpO2: 97%   97%  Weight:      Height:       Eyes: PERRL, lids and conjunctivae normal ENMT: Mucous membranes are moist. Posterior pharynx clear of any exudate or lesions. Normal dentition.  Neck: very thin, no masses Respiratory: clear to auscultation bilaterally, no wheezing, no crackles. Normal respiratory effort. No accessory muscle use.  Cardiovascular: Tachycardic and intermittently  irregular.  No murmurs / rubs / gallops. No extremity edema. 2+ pedal pulses.  GI: abdomen is soft and compressible.  No distention.  No tenderness.  No masses palpated.  Bowel sounds are present. Musculoskeletal:  Gross deformity in both hands.  Good ROM, no contractures. Normal muscle tone.  Skin: no rashes, warm and dry Neurologic: CN 2-12 grossly intact. Sensation intact, Generalized weakness. Psychiatric: Awake and alert but impaired judgment and insight due to history of dementia.    Labs on Admission: I have personally reviewed following labs and imaging studies  CBC:  Recent Labs Lab 11/14/16 2317  WBC 19.0*  NEUTROABS 17.2*  HGB 12.7  HCT 39.7  MCV 94.7  PLT [id number]*   Basic Metabolic Panel:  Recent Labs Lab 11/14/16 2317  NA 138  K 3.6  CL 104  CO2 27  GLUCOSE 166*  BUN 18  CREATININE 0.69  CALCIUM 8.9   GFR: Estimated Creatinine Clearance: 30.2 mL/min (by C-G formula based on SCr of 0.69 mg/dL).  Urine analysis: Pending.  Radiological Exams on Admission: Dg Chest 1 View  Result Date: 11/15/2016 CLINICAL DATA:  Posterior rib pain after a fall EXAM: CHEST 1 VIEW COMPARISON:  09/18/2015 FINDINGS: Hyperinflation is present. Surgical clips in the left axilla. Non inclusion of the left CP angle. Hazy atelectasis at the right base. Possible tiny right effusion. No consolidation. Heart size within normal limits. Atherosclerosis. No pneumothorax. Possible old Hill-Sachs deformity of the right humeral head. IMPRESSION: 1. Hyperinflation. Right basilar atelectasis and possible small right effusion. No pneumothorax. Osteopenia limits evaluation for rib fracture. Electronically Signed   By: Donavan Foil M.D.   On: 11/15/2016 02:32   Dg Elbow 2 Views Right  Result Date: 11/15/2016 CLINICAL DATA:  Fall and diffuse arm pain EXAM: RIGHT ELBOW - 2 VIEW COMPARISON:  09/18/2015 FINDINGS: Bones appear osteopenic. No gross fracture or dislocation. No large elbow effusion.  IMPRESSION: Osteopenia. No obvious acute osseous abnormality. Radiographic follow-up recommended if persistent clinical concern for elbow fracture Electronically Signed   By: Donavan Foil M.D.   On: 11/15/2016 02:24   Dg Wrist Complete Right  Result Date: 11/15/2016 CLINICAL DATA:  Fall with diffuse pain EXAM: RIGHT WRIST - COMPLETE 3+ VIEW COMPARISON:  None. FINDINGS: Diffuse osteopenia is present. There is deformity of the carpal bones with fusion across the radioulnar articulation. Multiple intercarpal fusion and probable fusion of the lunate to the radius and ulna. Irregularity and collapse of the scaphoid bone. No gross acute fracture. IMPRESSION: 1. No gross acute fracture identified. 2. Gross deformity at the carpal joint which may be secondary to remote trauma or advanced arthritis. Electronically Signed  By: Donavan Foil M.D.   On: 11/15/2016 02:28   Dg Shoulder Right Port  Result Date: 11/15/2016 CLINICAL DATA:  Status post fall with right posterior rib pain an diffuse arm pain. EXAM: PORTABLE RIGHT SHOULDER COMPARISON:  None. FINDINGS: No acute fracture or shoulder dislocation identified. The acromioclavicular interval is maintained. Bones are demineralized. IMPRESSION: Negative. Electronically Signed   By: Kristine Garbe M.D.   On: 11/15/2016 02:24   Dg Hip Unilat  With Pelvis 2-3 Views Right  Result Date: 11/14/2016 CLINICAL DATA:  83 y/o  F; right hip and wrist pain after fall. EXAM: DG HIP (WITH OR WITHOUT PELVIS) 2-3V RIGHT COMPARISON:  None. FINDINGS: Right femoral neck impacted and mildly displaced acute fracture with coxa vera deformity. The femoral head is well seated in the hip joint. No other fracture is identified. IMPRESSION: Right femoral neck impacted and mildly displaced acute fracture with coxa vera deformity. No hip dislocation. Electronically Signed   By: Kristine Garbe M.D.   On: 11/14/2016 22:47    EKG: Independently reviewed. Sinus  tachycardia.  Assessment/Plan Principal Problem:   Closed right hip fracture (HCC) Active Problems:   Dementia   History of breast cancer   Atrial fibrillation (HCC)      Right hip fracture.  No history to suggest unstable angina or acute decompensation secondary to heart failure, but I suspect cardiology will need to see her due to the new arrhythmia prior to going to the OR. --Dr. Alvan Dame to see in the AM. --Clear liquids for now since I suspect her OR time to be delayed due to arrhythmia --Sub Q lovenox (therapeutic dosing) for now --Bed rest --IV morphine prn --Expect she will need SNF  New onset atrial fibrillation, likely influenced by acute illness --Anticoagulate with subQ lovenox for now --Check Troponin, TFTs, echo --S/P IV cardizem x 1 in the ED.  Will start low dose oral metoprolol on the floor.  Low treshold to go to cardizem drip if HR is refractory.  Hypothyroidism --Continue home dose of levothyroxine.  DVT prophylaxis: Anticoagulated with lovenox (40mg  q12h) Code Status: FULL Code until she is through surgery, then DNR per discussion with family.  She has a MOST form at home, which the family has been advised to bring in. Family Communication: Patient's husband and son present in the ED at the time of admission.  Patient's son wants to be call if no family is present for rounds.  [phone]. Disposition Plan: Expect she will need placement after surgery. Consults called: Ortho (Dr. Alvan Dame); I suspect ortho will want formal cardiology consult, given the new arrhythmia, prior to going to the OR. Admission status: Inpatient, stepdown unit.  I expect this patient will need inpatient services for greater than two midnights.   TIME SPENT: 70 minutes   Eber Jones MD [facility] Pager [phone]  If 7PM-7AM, please contact night-coverage [email or web address] Password [facility]  11/15/2016, 2:40 AM    4:30AM Patient maintaining sinus tachycardia, HR 104,  after receiving initial interventions in the ED.  Lovenox dosing reduced to once daily for now.  Continue to observe on telemetry.  Lily Kocher, MD

## 2016-11-16 ENCOUNTER — Encounter (HOSPITAL_COMMUNITY): Payer: Self-pay | Admitting: Orthopedic Surgery

## 2016-11-16 ENCOUNTER — Inpatient Hospital Stay (HOSPITAL_COMMUNITY): Payer: Medicare Other

## 2016-11-16 DIAGNOSIS — E039 Hypothyroidism, unspecified: Secondary | ICD-10-CM

## 2016-11-16 LAB — BASIC METABOLIC PANEL
ANION GAP: 6 (ref 5–15)
BUN: 14 mg/dL (ref 6–20)
CO2: 26 mmol/L (ref 22–32)
Calcium: 8.3 mg/dL — ABNORMAL LOW (ref 8.9–10.3)
Chloride: 105 mmol/L (ref 101–111)
Creatinine, Ser: 0.71 mg/dL (ref 0.44–1.00)
GFR calc Af Amer: >60 mL/min (ref >60)
Glucose, Bld: 123 mg/dL — ABNORMAL HIGH (ref 65–99)
POTASSIUM: 3.8 mmol/L (ref 3.5–5.1)
SODIUM: 137 mmol/L (ref 135–145)

## 2016-11-16 LAB — ECHOCARDIOGRAM COMPLETE
HEIGHTINCHES: 62 in
WEIGHTICAEL: 1361.56 [oz_av]

## 2016-11-16 LAB — CBC
HCT: 34.7 % — ABNORMAL LOW (ref 36.0–46.0)
Hemoglobin: 10.9 g/dL — ABNORMAL LOW (ref 12.0–15.0)
MCH: 29.5 pg (ref 26.0–34.0)
MCHC: 31.4 g/dL (ref 30.0–36.0)
MCV: 93.8 fL (ref 78.0–100.0)
PLATELETS: 289 10*3/uL (ref 150–400)
RBC: 3.7 MIL/uL — AB (ref 3.87–5.11)
RDW: 13.5 % (ref 11.5–15.5)
WBC: 11.7 10*3/uL — AB (ref 4.0–10.5)

## 2016-11-16 MED ORDER — ACETAMINOPHEN 500 MG PO TABS: 500.0000 mg | ORAL_TABLET | Freq: Four times a day (QID) | ORAL | Status: DC | PRN

## 2016-11-16 MED ORDER — TRAMADOL HCL 50 MG PO TABS: 50.0000 mg | ORAL_TABLET | Freq: Four times a day (QID) | ORAL | 0 refills | Status: AC | PRN

## 2016-11-16 MED ORDER — ASPIRIN EC 325 MG PO TBEC
325.0000 mg | DELAYED_RELEASE_TABLET | Freq: Two times a day (BID) | ORAL | Status: DC
  Filled 2016-11-16: qty 1

## 2016-11-16 MED ORDER — ACETAMINOPHEN 650 MG RE SUPP: 325.0000 mg | Freq: Four times a day (QID) | RECTAL | Status: DC | PRN

## 2016-11-16 MED ORDER — TRAMADOL HCL 50 MG PO TABS
50.0000 mg | ORAL_TABLET | Freq: Four times a day (QID) | ORAL | Status: DC | PRN
  Administered 2016-11-16: 50 mg via ORAL
  Filled 2016-11-16: qty 1

## 2016-11-16 MED ORDER — ASPIRIN 325 MG PO TBEC: 325.0000 mg | DELAYED_RELEASE_TABLET | Freq: Two times a day (BID) | ORAL | 0 refills | Status: AC

## 2016-11-16 NOTE — Clinical Social Work Placement (Signed)
   CLINICAL SOCIAL WORK PLACEMENT  NOTE  Date:  11/16/2016  Patient Details  Name: [name] MRN: [id number] Date of Birth: [birth year]  Clinical Social Work is seeking post-discharge placement for this patient at the [place] level of care (*CSW will initial, date and re-position this form in  chart as items are completed):  Yes   Patient/family provided with [place] Work Department's list of facilities offering this level of care within the geographic area requested by the patient (or if unable, by the patient's family).  Yes   Patient/family informed of their freedom to choose among providers that offer the needed level of care, that participate in Medicare, Medicaid or managed care program needed by the patient, have an available bed and are willing to accept the patient.  Yes   Patient/family informed of 's ownership interest in [facility] and [facility], as well as of the fact that they are under no obligation to receive care at these facilities.  PASRR submitted to EDS on       PASRR number received on       Existing PASRR number confirmed on 11/16/16     FL2 transmitted to all facilities in geographic area requested by pt/family on 11/16/16     FL2 transmitted to all facilities within larger geographic area on       Patient informed that his/her managed care company has contracts with or will negotiate with certain facilities, including the following:            Patient/family informed of bed offers received.  Patient chooses bed at       Physician recommends and patient chooses bed at      Patient to be transferred to   on  .  Patient to be transferred to facility by       Patient family notified on   of transfer.  Name of family member notified:        PHYSICIAN       Additional Comment:    _______________________________________________ Standley Brooking, LCSW 11/16/2016, 3:17 PM

## 2016-11-16 NOTE — Progress Notes (Signed)
  Echocardiogram 2D Echocardiogram has been performed.  [name] 11/16/2016, 11:18 AM

## 2016-11-16 NOTE — Evaluation (Signed)
Physical Therapy Evaluation Patient Details Name: [name] MRN: [id number] DOB: 03-29-1927 Today's Date: 11/16/2016   History of Present Illness  83 y.o. woman with a history of breast cancer, dementia, RA, and hypothyroidism who was in her baseline state of health today when she sustained a mechanical fall at home and sustained right femoral neck fracture, now s/p R hip hemiarthroplasty  Clinical Impression  Patient is s/p above surgery resulting in functional limitations due to the deficits listed below (see PT Problem List).  Patient will benefit from skilled PT to increase their independence and safety with mobility to allow discharge to the venue listed below.  Pt requiring increased assist for mobility and now with PWB and posterior hip precautions.  Pt would benefit from SNF upon d/c prior to return home.     Follow Up Recommendations SNF    Equipment Recommendations  None recommended by PT    Recommendations for Other Services       Precautions / Restrictions Precautions Precautions: Fall;Posterior Hip Restrictions Weight Bearing Restrictions: Yes RLE Weight Bearing: Partial weight bearing RLE Partial Weight Bearing Percentage or Pounds: 50%      Mobility  Bed Mobility Overal bed mobility: Needs Assistance Bed Mobility: Supine to Sit     Supine to sit: Max assist;+2 for physical assistance     General bed mobility comments: verbal cues for technique, encouraged pt to attempt and pt initiates however limited by weakness and pain, maintained precautions  Transfers Overall transfer level: Needs assistance Equipment used: Rolling walker (2 wheeled) Transfers: Sit to/from [facility] Sit to Stand: Mod assist;+2 physical assistance Stand pivot transfers: Mod assist;+2 physical assistance       General transfer comment: multimodal cues for technique, pt kept hands on RW for rise, placed hand on armrests for controlling descent, pt attempted  steps however limited by pain so pivoted to recliner  Ambulation/Gait                Stairs            Wheelchair Mobility    Modified Rankin (Stroke Patients Only)       Balance Overall balance assessment: History of Falls                                           Pertinent Vitals/Pain Pain Assessment: Faces Faces Pain Scale: Hurts whole lot Pain Location: R hip with movement Pain Intervention(s): Repositioned;Premedicated before session;Ice applied;Monitored during session;Limited activity within patient's tolerance    Home Living   Living Arrangements: Spouse/significant other   Type of Home: House       Home Layout: Two level;1/2 bath on main level;Able to live on main level with bedroom/bathroom Home Equipment: Walker - 2 wheels      Prior Function Level of Independence: Independent         Comments: son states pt should be using walker however typically does not use at home     Hand Dominance        Extremity/Trunk Assessment   Upper Extremity Assessment Upper Extremity Assessment: Defer to OT evaluation (family reports chronic edema L arm and bil weakness)    Lower Extremity Assessment Lower Extremity Assessment: Generalized weakness;RLE deficits/detail RLE Deficits / Details: requires assist, maintained precautions RLE: Unable to fully assess due to pain       Communication   Communication: [facility]  Cognition Arousal/Alertness: Awake/alert Behavior During Therapy: WFL for tasks assessed/performed Overall Cognitive Status: History of cognitive impairments - at baseline                      General Comments      Exercises     Assessment/Plan    PT Assessment Patient needs continued PT services  PT Problem List Decreased strength;Decreased balance;Decreased knowledge of use of DME;Decreased activity tolerance;Decreased knowledge of precautions;Decreased mobility;Pain          PT Treatment  Interventions DME instruction;Gait training;Functional mobility training;Therapeutic exercise;Therapeutic activities;Patient/family education    PT Goals (Current goals can be found in the Care Plan section)  Acute Rehab PT Goals PT Goal Formulation: With patient/family Time For Goal Achievement: 11/23/16 Potential to Achieve Goals: Good    Frequency Min 3X/week   Barriers to discharge        Co-evaluation               End of Session Equipment Utilized During Treatment: Gait belt Activity Tolerance: Patient limited by fatigue;Patient limited by pain Patient left: in chair;with call bell/phone within reach;with chair alarm set;with family/visitor present Nurse Communication: Mobility status;Precautions;Weight bearing status (posterior hip prec and PWB on white board in room)         Time: 1150-1210 PT Time Calculation (min) (ACUTE ONLY): 20 min   Charges:   PT Evaluation $PT Eval Moderate Complexity: 1 Procedure     PT G Codes:        Birgit Nowling,KATHrine E 11/16/2016, 12:47 PM Carmelia Bake, PT, DPT 11/16/2016 Pager: [phone]

## 2016-11-16 NOTE — Discharge Summary (Signed)
Physician Discharge Summary  [name] [id number] DOB: 11-05-1926 DOA: 11/14/2016  PCP: Mathews Argyle, MD  Admit date: 11/14/2016 Discharge date: 11/17/2016  Admitted From: Home Disposition: SNF   Recommendations for Outpatient Follow-up:  1. Follow up with PCP in 1-2 weeks 2. Follow up with orthopedics, Dr. Alvan Dame in 2 weeks:  ASA 325 mg bid for 4 weeks for anticoagulation, unless other medically indicated.  Tramadol 50mg  prn for pain management (Rx written).  MiraLax and Colace for constipation  Iron 325 mg tid for 2-3 weeks   PWB   50% on the right leg.  Dressing to remain in place until follow in clinic in 2 weeks.  Dressing is waterproof and may shower with it in place. 3. Please obtain BMP/CBC in one week 4. Consider redraw of TFT's in 6 weeks. TSH normal at 0.507, free T4 1.39.  5. Monitor left arm lymphedema which is chronic. IV infiltrated and edema was at/below baseline per her son so ultrasound was not performed.   Home Health: N/A Equipment/Devices: N/A Discharge Condition: Stable CODE STATUS: Full Diet recommendation: Regular  Brief/Interim Summary: Doresa is an 83 year old female with a PMH of dementia, hypothyroidism, IgM kappa MGUS, and remote hx of breast cancer s/p L mastectomy and axillary lymph node dissection presenting with closed right femoral neck fracture following a mechanical fall. In the ED, she was noted to be in new A-fib with RVR but converted to sinus tach after IV diltiazem and morphine. Since her heart rate has improved to NSR and pain has been controlled. Arthroplasty was performed 2/7 with uneventful post-operative course.  Discharge Diagnoses:  Principal Problem:   Closed right hip fracture [facility]) Active Problems:   Dementia   History of breast cancer   Atrial fibrillation (HCC)   Pain  Closed right femoral neck fracture s/p right hip arthroplasty 11/15/2016 with Dr. Alvan Dame: Following what seems like a mechanical fall at home.  Pain well-controlled. - Tramadol written by orthopedics for pain control. Further recommendations from orthopedics as above. - PT/OT recommending SNF at discharge. - ASA 325mg  BID for DVT ppx as above  Transient new onset A fib with RVR: Pt has no previous diagnosis of A-fib in the past. Had rhythm disturbance at admission resolved after single dose of IV cardizem. Metoprolol was also added. Has been in sinus rhythm since then. Trops negative. TSH normal. - Plan to continue metoprolol due to sinus tachycardia and to prevent conversion back to AFib.  - ECHO normal with nondilated left atrium.  Hypothyroidism: TSH normal - Continue home Synthroid  Discharge Instructions Discharge Instructions    Partial weight bearing    Complete by:  As directed    % Body Weight:  50   Laterality:  right   Extremity:  Lower     Allergies as of 11/17/2016   No Known Allergies     Medication List    TAKE these medications   acetaminophen 500 MG tablet Commonly known as:  TYLENOL Take 500 mg by mouth every 6 (six) hours as needed.   aspirin 325 MG EC tablet Take 1 tablet (325 mg total) by mouth 2 (two) times daily.   CALCIUM + D PO Take 1 tablet by mouth daily.   levothyroxine 50 MCG tablet Commonly known as:  SYNTHROID, LEVOTHROID Take 1.5 tablets (75 mcg total) by mouth daily before breakfast.   metoprolol succinate 25 MG 24 hr tablet Commonly known as:  TOPROL-XL Take 0.5 tablets (12.5 mg total) by mouth  daily.   senna 8.6 MG Tabs tablet Commonly known as:  SENOKOT Take 1 tablet (8.6 mg total) by mouth 2 (two) times daily as needed for mild constipation.   traMADol 50 MG tablet Commonly known as:  ULTRAM Take 1-2 tablets (50-100 mg total) by mouth every 6 (six) hours as needed for moderate pain.      Follow-up Information    Mauri Pole, MD. Schedule an appointment as soon as possible for a visit in 2 week(s).   Specialty:  Orthopedic Surgery Contact information: [address] [place] [address] [phone]        Mathews Argyle, MD Follow up.   Specialty:  Internal Medicine Contact information: [address] [place] [address] [place] [address] [phone]          No Known Allergies  Consultations:  Orthopedics, Dr. Alvan Dame.  Procedures/Studies: Dg Chest 1 View  Result Date: 11/15/2016 CLINICAL DATA:  Posterior rib pain after a fall EXAM: CHEST 1 VIEW COMPARISON:  09/18/2015 FINDINGS: Hyperinflation is present. Surgical clips in the left axilla. Non inclusion of the left CP angle. Hazy atelectasis at the right base. Possible tiny right effusion. No consolidation. Heart size within normal limits. Atherosclerosis. No pneumothorax. Possible old Hill-Sachs deformity of the right humeral head. IMPRESSION: 1. Hyperinflation. Right basilar atelectasis and possible small right effusion. No pneumothorax. Osteopenia limits evaluation for rib fracture. Electronically Signed   By: Donavan Foil M.D.   On: 11/15/2016 02:32   Dg Elbow 2 Views Right  Result Date: 11/15/2016 CLINICAL DATA:  Fall and diffuse arm pain EXAM: RIGHT ELBOW - 2 VIEW COMPARISON:  09/18/2015 FINDINGS: Bones appear osteopenic. No gross fracture or dislocation. No large elbow effusion. IMPRESSION: Osteopenia. No obvious acute osseous abnormality. Radiographic follow-up recommended if persistent clinical concern for elbow fracture Electronically Signed   By: Donavan Foil M.D.   On: 11/15/2016 02:24   Dg Wrist Complete Right  Result Date: 11/15/2016 CLINICAL DATA:  Fall with diffuse pain EXAM: RIGHT WRIST - COMPLETE 3+ VIEW COMPARISON:  None. FINDINGS: Diffuse osteopenia is present. There is deformity of the carpal bones with fusion across the radioulnar articulation. Multiple intercarpal fusion and probable fusion of the lunate to the radius and ulna. Irregularity and collapse of the scaphoid bone. No gross acute fracture. IMPRESSION: 1. No gross acute fracture  identified. 2. Gross deformity at the carpal joint which may be secondary to remote trauma or advanced arthritis. Electronically Signed   By: Donavan Foil M.D.   On: 11/15/2016 02:28   Pelvis Portable  Result Date: 11/15/2016 CLINICAL DATA:  Right hip hemiarthroplasty. EXAM: PORTABLE PELVIS 1-2 VIEWS COMPARISON:  Right hip radiograph 11/14/2016 FINDINGS: The patient has undergone right hip hemiarthroplasty. There is no evidence of immediate complication. The components appear well seated. IMPRESSION: Status post right hip hemiarthroplasty without evidence of immediate hardware complication. Electronically Signed   By: Ulyses Jarred M.D.   On: 11/15/2016 21:06   Dg Shoulder Right Port  Result Date: 11/15/2016 CLINICAL DATA:  Status post fall with right posterior rib pain an diffuse arm pain. EXAM: PORTABLE RIGHT SHOULDER COMPARISON:  None. FINDINGS: No acute fracture or shoulder dislocation identified. The acromioclavicular interval is maintained. Bones are demineralized. IMPRESSION: Negative. Electronically Signed   By: Kristine Garbe M.D.   On: 11/15/2016 02:24   Dg Hip Unilat  With Pelvis 2-3 Views Right  Result Date: 11/14/2016 CLINICAL DATA:  83 y/o  F; right hip and wrist pain after fall.  EXAM: DG HIP (WITH OR WITHOUT PELVIS) 2-3V RIGHT COMPARISON:  None. FINDINGS: Right femoral neck impacted and mildly displaced acute fracture with coxa vera deformity. The femoral head is well seated in the hip joint. No other fracture is identified. IMPRESSION: Right femoral neck impacted and mildly displaced acute fracture with coxa vera deformity. No hip dislocation. Electronically Signed   By: Kristine Garbe M.D.   On: 11/14/2016 22:47   ECHOCARDIOGRAM 11/16/2016: Study Conclusions  - Left ventricle: The cavity size was normal. Systolic function was   normal. The estimated ejection fraction was in the range of 55%   to 60%. Wall motion was normal; there were no regional wall   motion  abnormalities. - Pulmonary arteries: Systolic pressure was mildly increased. PA   peak pressure: 36 mm Hg (S).  Subjective: Pt complains of mild right hip soreness worst with movement. Still has right wrist pain which has been chronic for many years since a remote wrist fracture. No palpitations, dizziness, chest pain, dyspnea.   Discharge Exam: Vitals:   11/17/16 0517 11/17/16 1359  BP: 112/73 (!) 112/57  Pulse: (!) 105 (!) 113  Resp: 16 16  Temp: 97.9 F (36.6 C) 98.3 F (36.8 C)   General: Elderly, pleasant female in no acute distress Cardiovascular: RRR, S1/S2 +, no rubs, no gallops Respiratory: CTA bilaterally, no wheezing, no rhonchi Abdominal: Soft, NT, ND, bowel sounds + Extremities: Right leg held in external rotation, normal tone, dressing c/d/i. Compartment soft, sensorimotor intact distally. Brisk cap refill throughout. Left arm with stable lymphedema, improved actually from baseline per son's report  The results of significant diagnostics from this hospitalization (including imaging, microbiology, ancillary and laboratory) are listed below for reference.    Labs: BNP (last 3 results)  Recent Labs  11/15/16 0530  BNP [id number]*   Basic Metabolic Panel:  Recent Labs Lab 11/14/16 2317 11/16/16 0452 11/17/16 0440  NA 138 137 139  K 3.6 3.8 4.0  CL 104 105 103  CO2 27 26 30   GLUCOSE 166* 123* 129*  BUN 18 14 23*  CREATININE 0.69 0.71 0.77  CALCIUM 8.9 8.3* 8.5*   CBC:  Recent Labs Lab 11/14/16 2317 11/16/16 0452 11/17/16 0440  WBC 19.0* 11.7* 10.3  NEUTROABS 17.2*  --   --   HGB 12.7 10.9* 10.8*  HCT 39.7 34.7* 34.5*  MCV 94.7 93.8 97.5  PLT 402* 289 261   Cardiac Enzymes:  Recent Labs Lab 11/15/16 0228 11/15/16 0832 11/15/16 1400  TROPONINI <0.03 <0.03 <0.03   Thyroid function studies  Recent Labs  11/15/16 0228  TSH 0.507   Urinalysis    Component Value Date/Time   COLORURINE YELLOW 09/18/2015 1149   APPEARANCEUR CLOUDY (A)  09/18/2015 1149   LABSPEC 1.010 09/18/2015 1149   PHURINE 7.5 09/18/2015 1149   GLUCOSEU NEGATIVE 09/18/2015 1149   HGBUR SMALL (A) 09/18/2015 1149   BILIRUBINUR NEGATIVE 09/18/2015 1149   KETONESUR NEGATIVE 09/18/2015 1149   PROTEINUR NEGATIVE 09/18/2015 1149   UROBILINOGEN 1.0 08/07/2013 0328   NITRITE NEGATIVE 09/18/2015 1149   LEUKOCYTESUR NEGATIVE 09/18/2015 1149   Microbiology Recent Results (from the past 240 hour(s))  MRSA PCR Screening     Status: None   Collection Time: 11/15/16  5:10 AM  Result Value Ref Range Status   MRSA by PCR NEGATIVE NEGATIVE Final    Comment:        The GeneXpert MRSA Assay (FDA approved for NASAL specimens only), is one component of a comprehensive MRSA colonization  surveillance program. It is not intended to diagnose MRSA infection nor to guide or monitor treatment for MRSA infections.     Time coordinating discharge: Approximately 40 minutes  Vance Gather, MD  [facility] 11/17/2016, 2:40 PM Pager [phone]

## 2016-11-16 NOTE — Progress Notes (Signed)
Resumed care of patient. Agree with previous assessment. Orders reviewed. Will continue to monitor. 

## 2016-11-16 NOTE — Clinical Social Work Note (Signed)
Clinical Social Work Assessment  Patient Details  Name: [name] MRN: [id number] Date of Birth: [birth year]  Date of referral:  11/16/16               Reason for consult:  Facility Placement                Permission sought to share information with:  Chartered certified accountant granted to share information::  Yes, Verbal Permission Granted  Name::        Agency::     Relationship::     Contact Information:     Housing/Transportation Living arrangements for the past 2 months:  Single Family Home Source of Information:  Patient, Adult Children, Spouse Patient Interpreter Needed:  None Criminal Activity/Legal Involvement Pertinent to Current Situation/Hospitalization:  No - Comment as needed Significant Relationships:  Spouse Lives with:  Self Do you feel safe going back to the place where you live?  No Need for family participation in patient care:  Yes (Comment)  Care giving concerns:  CSW reviewed PT evaluation recommending SNF at discharge.    Social Worker assessment / plan:  CSW spoke with patient & son, Cecilie Lowers via phone re: discharge planning - they are agreeable to SNF, would prefer [facility] in [place] as she has been there in the past. CSW awaiting call back from [facility] re: bed offer/availability.   Employment status:  Retired Nurse, adult PT Recommendations:  [place] / Referral to community resources:  [place]  Patient/Family's Response to care:    Patient/Family's Understanding of and Emotional Response to Diagnosis, Current Treatment, and Prognosis:    Emotional Assessment Appearance:  Appears stated age Attitude/Demeanor/Rapport:    Affect (typically observed):    Orientation:  Oriented to Self, Oriented to Place, Oriented to  Time Alcohol / Substance use:    Psych involvement (Current and /or in the community):     Discharge Needs  Concerns to be  addressed:    Readmission within the last 30 days:    Current discharge risk:    Barriers to Discharge:      Standley Brooking, LCSW 11/16/2016, 3:16 PM

## 2016-11-16 NOTE — Evaluation (Signed)
Occupational Therapy Evaluation and Discharge Patient Details Name: [name] MRN: [id number] DOB: [birth year] Today's Date: 11/16/2016    History of Present Illness 83 y.o. woman with a history of breast cancer, dementia, RA, and hypothyroidism who was in her baseline state of health today when she sustained a mechanical fall at home and sustained right femoral neck fracture, now s/p R hip hemiarthroplasty   Clinical Impression   This 83 yo female admitted and underwent above presents to acute OT with deficits below (see OT problem list) thus affecting her PLOF of being mobile and able to A with some of her basic ADLs. She will benefit from continued OT at SNF to increase independence and decrease burden of care. Acute OT will defer remainder of OT to SNF.    Follow Up Recommendations  SNF;Supervision/Assistance - 24 hour    Equipment Recommendations  Other (comment) (TBD next venue)       Precautions / Restrictions Precautions Precautions: Fall;Posterior Hip Restrictions Weight Bearing Restrictions: Yes RLE Weight Bearing: Partial weight bearing RLE Partial Weight Bearing Percentage or Pounds: 50%      Mobility Bed Mobility Overal bed mobility: Needs Assistance Bed Mobility: Rolling;Sit to Supine Rolling: Max assist (to get pads repositioned under her and using a pillow between her knees)    Sit to supine: Total assist;+2 for physical assistance    Transfers Overall transfer level: Needs assistance Equipment used:  (2 person A with gait belt and bed pad) Transfers: Squat Pivot Transfers Squat pivot transfers: +2 physical assistance;Total assist        Balance Overall balance assessment: Needs assistance;History of Falls Sitting-balance support: No upper extremity supported;Feet supported Sitting balance-Leahy Scale: Poor                                      ADL Overall ADL's : Needs assistance/impaired Eating/Feeding: Total  assistance Eating/Feeding Details (indicate cue type and reason): due to not a good way to get her set up for good positioning of food tray and her postitioning to facilitate her self -feeding (bed or recliner) Grooming: Wash/dry hands;Wash/dry face;Maximal assistance (sitting in recliner)   Upper Body Bathing: Maximal assistance (sitting in recliner)   Lower Body Bathing: Total assistance;Bed level   Upper Body Dressing : Total assistance (sitting in recliner)   Lower Body Dressing: Total assistance;Bed level   Toilet Transfer: Total assistance;+2 for physical assistance;Squat-pivot Toilet Transfer Details (indicate cue type and reason): Recliner>bed using bed pad under her and gait belt Toileting- Clothing Manipulation and Hygiene: Total assistance;Bed level               Pertinent Vitals/Pain Pain Assessment: Faces Faces Pain Scale: Hurts even more Pain Location: R hip and Bil shoulders with movement Pain Descriptors / Indicators: Aching;Sore Pain Intervention(s): Limited activity within patient's tolerance;Monitored during session;Repositioned;Ice applied (ice to hip)     Hand Dominance Right   Extremity/Trunk Assessment Upper Extremity Assessment Upper Extremity Assessment: RUE deficits/detail;LUE deficits/detail RUE Deficits / Details: Decreased A/PROM of shoulder due to OA. Pta pt was able to eat with RUE while seated and meal set up for her RUE Coordination: decreased gross motor;decreased fine motor LUE Deficits / Details: Decreased A/PROM of shoulder due to OA and lymphedema.  LUE Coordination: decreased fine motor;decreased gross motor       Communication Communication Communication: HOH   Cognition Arousal/Alertness: Awake/alert Behavior During Therapy: WFL for tasks  assessed/performed Overall Cognitive Status: History of cognitive impairments - at baseline                                Home Living Family/patient expects to be discharged  to:: Skilled nursing facility Living Arrangements: Spouse/significant other   Type of Home: [place]: Two level;1/2 bath on main level;Able to live on main level with bedroom/bathroom               Home Equipment: Walker - 2 wheels          Prior Functioning/Environment Level of Independence: Needs assistance    ADL's / Homemaking Assistance Needed: Had A for all basic ADLs due to decreased A/PROM of Bil shoulders and lymphedema in LUE   Comments: son states pt should be using walker however typically does not use at home        OT Problem List: Decreased strength;Decreased range of motion;Decreased activity tolerance;Impaired balance (sitting and/or standing);Pain;Impaired UE functional use;Decreased knowledge of precautions      OT Goals(Current goals can be found in the care plan section) Acute Rehab OT Goals   OT Frequency:                End of Session Equipment Utilized During Treatment: Gait belt Nurse Communication: Mobility status (RN A me with getting pt back to bed)  Activity Tolerance: Patient limited by pain Patient left: in bed;with call bell/phone within reach;with bed alarm set;with SCD's reapplied   Time: 1351-1415 OT Time Calculation (min): 24 min Charges:  OT General Charges $OT Visit: 1 Procedure OT Evaluation $OT Eval Moderate Complexity: 1 Procedure OT Treatments $Self Care/Home Management : 8-22 mins  Almon Register [id number] 11/16/2016, 2:25 PM

## 2016-11-16 NOTE — NC FL2 (Signed)
[place] LEVEL OF CARE SCREENING TOOL     IDENTIFICATION  Patient Name: [name] Birthdate: 1927/02/20 Sex: female Admission Date (Current Location): 11/14/2016  [facility] and [place] Number:  Herbalist and Address:  [facility],  [place] [address] [place]      Provider Number: [id number]  Attending Physician Name and Address:  Patrecia Pour, MD  Relative Name and Phone Number:       Current Level of Care: Hospital Recommended Level of Care: [place] Prior Approval Number:    Date Approved/Denied:   PASRR Number: [id number]  Discharge Plan: SNF    Current Diagnoses: Patient Active Problem List   Diagnosis Date Noted  . Closed right hip fracture ([place]) 11/15/2016  . Atrial fibrillation ([place]) 11/15/2016  . Pain   . MGUS (monoclonal gammopathy of unknown significance) 01/21/2014  . History of breast cancer 01/21/2014  . Back pain 08/09/2013  . Severe protein-calorie malnutrition ([place]) 08/08/2013  . Palliative care encounter 08/08/2013  . Weakness generalized 08/08/2013  . Fall 08/07/2013  . Syncope 08/07/2013  . Dementia 08/07/2013  . Failure to thrive in adult 08/07/2013    Orientation RESPIRATION BLADDER Height & Weight     Self, Place, Time  Normal Incontinent Weight: 85 lb 1.6 oz (38.6 kg) Height:  5\' 2"  (157.5 cm)  BEHAVIORAL SYMPTOMS/MOOD NEUROLOGICAL BOWEL NUTRITION STATUS      Incontinent Diet  AMBULATORY STATUS COMMUNICATION OF NEEDS Skin   Extensive Assist Verbally Normal                       Personal Care Assistance Level of Assistance  Bathing, Dressing Bathing Assistance: Limited assistance   Dressing Assistance: Limited assistance     Functional Limitations Info             SPECIAL CARE FACTORS FREQUENCY  PT (By licensed PT), OT (By licensed OT)     PT Frequency: 5 OT Frequency: 5            Contractures      Additional Factors Info  Code Status,  Allergies Code Status Info: Fullcode Allergies Info: NKDA           Current Medications (11/16/2016):  This is the current hospital active medication list Current Facility-Administered Medications  Medication Dose Route Frequency Provider Last Rate Last Dose  . 0.9 %  sodium chloride infusion   Intravenous Continuous Paralee Cancel, MD 50 mL/hr at 11/15/16 2205    . acetaminophen (TYLENOL) suppository 325 mg  325 mg Rectal Q6H PRN Patrecia Pour, MD       Or  . acetaminophen (TYLENOL) tablet 500 mg  500 mg Oral Q6H PRN Patrecia Pour, MD      . alum & mag hydroxide-simeth (MAALOX/MYLANTA) 200-200-20 MG/5ML suspension 30 mL  30 mL Oral Q4H PRN Paralee Cancel, MD      . aspirin EC tablet 325 mg  325 mg Oral BID Danae [place], PA-C      . docusate sodium (COLACE) capsule 100 mg  100 mg Oral BID Paralee Cancel, MD   100 mg at 11/16/16 [id number]  . feeding supplement (ENSURE ENLIVE) (ENSURE ENLIVE) liquid 237 mL  237 mL Oral Q24H Patrecia Pour, MD   237 mL at 11/16/16 1400  . levothyroxine (SYNTHROID, LEVOTHROID) tablet 75 mcg  75 mcg Oral QAC breakfast Lily Kocher, MD   75 mcg at 11/16/16 [phone]  . MEDLINE  mouth rinse  15 mL Mouth Rinse BID Patrecia Pour, MD   15 mL at 11/16/16 1000  . menthol-cetylpyridinium (CEPACOL) lozenge 3 mg  1 lozenge Oral PRN Paralee Cancel, MD       Or  . phenol (CHLORASEPTIC) mouth spray 1 spray  1 spray Mouth/Throat PRN Paralee Cancel, MD      . metoCLOPramide (REGLAN) tablet 5-10 mg  5-10 mg Oral Q8H PRN Paralee Cancel, MD       Or  . metoCLOPramide (REGLAN) injection 5-10 mg  5-10 mg Intravenous Q8H PRN Paralee Cancel, MD      . metoprolol tartrate (LOPRESSOR) tablet 12.5 mg  12.5 mg Oral BID Lily Kocher, MD   12.5 mg at 11/16/16 [id number]  . morphine 2 MG/ML injection 0.5 mg  0.5 mg Intravenous Q2H PRN Lily Kocher, MD      . multivitamin with minerals tablet 1 tablet  1 tablet Oral Daily Patrecia Pour, MD   1 tablet at 11/16/16 [phone]  . ondansetron (ZOFRAN) tablet 4 mg  4 mg Oral Q6H PRN  Paralee Cancel, MD       Or  . ondansetron [facility]) injection 4 mg  4 mg Intravenous Q6H PRN Paralee Cancel, MD      . senna [facility]) tablet 8.6 mg  1 tablet Oral BID Lily Kocher, MD   8.6 mg at 11/16/16 [id number]  . traMADol (ULTRAM) tablet 50-100 mg  50-100 mg Oral Q6H PRN Danae [place], PA-C   50 mg at 11/16/16 1127     Discharge Medications: Please see discharge summary for a list of discharge medications.  Relevant Imaging Results:  Relevant Lab Results:   Additional Information SSN: [id number]  Standley Brooking, LCSW

## 2016-11-16 NOTE — Progress Notes (Signed)
PROGRESS NOTE    [name]  [id number] DOB: 06-08-1927 DOA: 11/14/2016 PCP: Mathews Argyle, MD Outpatient Specialists: Dr. Alvy Bimler (Oncology)  Brief Narrative:  [name] is an 83 year old female with a PMH of dementia, hypothyroidism, IgM kappa MGUS, and remote hx of breast cancer s/p L mastectomy and axillary lymph node dissection presenting with closed right femoral neck fracture following a mechanical fall. In the ED, she was noted to be in new A-fib with RVR but converted to sinus tach after IV diltiazem and morphine. Since her heart rate has improved  to NSR and pain has been controlled. She went for operative management 2/7 with right arthroplasty and has had an uneventful postoperative course.   Assessment & Plan:   Principal Problem:   Closed right hip fracture [facility]) Active Problems:   Dementia   History of breast cancer   Atrial fibrillation (HCC)   Pain  Closed right femoral neck fracture s/p right hip arthroplasty 11/15/2016 with Dr. Alvan Dame: Following what seems like a mechanical fall at home. Pain well-controlled. - Tramadol written by orthopedics for pain control. Further recommendations from orthopedics as above. - PT/OT recommending SNF at discharge.  Transient new onset A fib with RVR: Pt has no previous diagnosis of A-fib in the past. Had rhythm disturbance at admission resolved after single dose of IV cardizem. Metoprolol was also added. Has been in sinus rhythm since then. Trops negative. TSH normal. - Has had intermittent lower blood pressures: Stop lopressor 12.5mg  BID now that outside scope of acute trauma/illness and monitor rate and rhythm.  - ECHO normal  Hypothyroidism: TSH normal - Continue home Synthroid DVT prophylaxis: Lovenox Code Status: Full code until she is through surgery, then DNR Family Communication: Husband present at bedside, updated on plan. Disposition Plan: Transfer out of stepdown today. Will obtain PT/OT after surgery. She  will likely go to SNF.  Consultants:   Orthopedic surgery (Dr. Alvan Dame)  Procedures:   None  Antimicrobials:   None  Subjective: Pt complains of mild right hip soreness worst with movement. Still has right wrist pain which has been chronic for many years since a remote wrist fracture. No palpitations, dizziness, chest pain, dyspnea.   Objective: Vitals:   11/15/16 2235 11/16/16 0020 11/16/16 0533 11/16/16 1400  BP: 125/75 132/75 120/71 (!) 92/56  Pulse: 97 97 87 100  Resp: 14 16 14 14   Temp: 97.4 F (36.3 C) 98.3 F (36.8 C) 98.1 F (36.7 C) 98.2 F (36.8 C)  TempSrc: Oral Oral Oral Oral  SpO2: 98% 99% 97% 98%  Weight:      Height:        Intake/Output Summary (Last 24 hours) at 11/16/16 1450 Last data filed at 11/16/16 0600  Gross per 24 hour  Intake          1905.83 ml  Output               50 ml  Net          1855.83 ml   Filed Weights   11/14/16 2034 11/15/16 0450  Weight: 38.6 kg (85 lb) 38.6 kg (85 lb 1.6 oz)    Examination:  General exam: Appears calm resting quietly, pleasant Respiratory system: Clear to auscultation. Respiratory effort normal. Cardiovascular system: S1 & S2 heard, tachycardic, regular rhythm. No JVD, murmurs, rubs, gallops or clicks. No pedal edema. Gastrointestinal system: Abdomen is nondistended, soft and nontender. No organomegaly or masses felt. Normal bowel sounds heard. Central nervous system: Alert and  oriented. No focal neurological deficits. Extremities: Right leg held in external rotation, normal tone, no ecchymosis appreciated. Compartment soft, sensorimotor intact distally. Brisk cap refill throughout.  Skin: No rashes, lesions or ulcers Psychiatry: Judgement and insight appear normal. Mood & affect appropriate.   Data Reviewed: I have personally reviewed following labs and imaging studies  CBC:  Recent Labs Lab 11/14/16 2317 11/16/16 0452  WBC 19.0* 11.7*  NEUTROABS 17.2*  --   HGB 12.7 10.9*  HCT 39.7 34.7*  MCV  94.7 93.8  PLT 402* [id number]   Basic Metabolic Panel:  Recent Labs Lab 11/14/16 2317 11/16/16 0452  NA 138 137  K 3.6 3.8  CL 104 105  CO2 27 26  GLUCOSE 166* 123*  BUN 18 14  CREATININE 0.69 0.71  CALCIUM 8.9 8.3*   GFR: Estimated Creatinine Clearance: 30.2 mL/min (by C-G formula based on SCr of 0.71 mg/dL). Coagulation Profile:  Recent Labs Lab 11/15/16 0228 11/15/16 0530  INR 1.12 1.17   Cardiac Enzymes:  Recent Labs Lab 11/15/16 0228 11/15/16 0832 11/15/16 1400  TROPONINI <0.03 <0.03 <0.03   Thyroid Function Tests:  Recent Labs  11/15/16 0228 11/15/16 0530  TSH 0.507  --   FREET4  --  1.39*   Urine analysis:    Component Value Date/Time   COLORURINE YELLOW 09/18/2015 1149   APPEARANCEUR CLOUDY (A) 09/18/2015 1149   LABSPEC 1.010 09/18/2015 1149   PHURINE 7.5 09/18/2015 1149   GLUCOSEU NEGATIVE 09/18/2015 1149   HGBUR SMALL (A) 09/18/2015 1149   BILIRUBINUR NEGATIVE 09/18/2015 1149   KETONESUR NEGATIVE 09/18/2015 1149   PROTEINUR NEGATIVE 09/18/2015 1149   UROBILINOGEN 1.0 08/07/2013 0328   NITRITE NEGATIVE 09/18/2015 1149   LEUKOCYTESUR NEGATIVE 09/18/2015 1149   Sepsis Labs: @LABRCNTIP (procalcitonin:4,lacticidven:4)  ) Recent Results (from the past 240 hour(s))  MRSA PCR Screening     Status: None   Collection Time: 11/15/16  5:10 AM  Result Value Ref Range Status   MRSA by PCR NEGATIVE NEGATIVE Final    Comment:        The GeneXpert MRSA Assay (FDA approved for NASAL specimens only), is one component of a comprehensive MRSA colonization surveillance program. It is not intended to diagnose MRSA infection nor to guide or monitor treatment for MRSA infections.     Radiology Studies: Dg Chest 1 View  Result Date: 11/15/2016 CLINICAL DATA:  Posterior rib pain after a fall EXAM: CHEST 1 VIEW COMPARISON:  09/18/2015 FINDINGS: Hyperinflation is present. Surgical clips in the left axilla. Non inclusion of the left CP angle. Hazy atelectasis  at the right base. Possible tiny right effusion. No consolidation. Heart size within normal limits. Atherosclerosis. No pneumothorax. Possible old Hill-Sachs deformity of the right humeral head. IMPRESSION: 1. Hyperinflation. Right basilar atelectasis and possible small right effusion. No pneumothorax. Osteopenia limits evaluation for rib fracture. Electronically Signed   By: Donavan Foil M.D.   On: 11/15/2016 02:32   Dg Elbow 2 Views Right  Result Date: 11/15/2016 CLINICAL DATA:  Fall and diffuse arm pain EXAM: RIGHT ELBOW - 2 VIEW COMPARISON:  09/18/2015 FINDINGS: Bones appear osteopenic. No gross fracture or dislocation. No large elbow effusion. IMPRESSION: Osteopenia. No obvious acute osseous abnormality. Radiographic follow-up recommended if persistent clinical concern for elbow fracture Electronically Signed   By: Donavan Foil M.D.   On: 11/15/2016 02:24   Dg Wrist Complete Right  Result Date: 11/15/2016 CLINICAL DATA:  Fall with diffuse pain EXAM: RIGHT WRIST - COMPLETE 3+ VIEW COMPARISON:  None.  FINDINGS: Diffuse osteopenia is present. There is deformity of the carpal bones with fusion across the radioulnar articulation. Multiple intercarpal fusion and probable fusion of the lunate to the radius and ulna. Irregularity and collapse of the scaphoid bone. No gross acute fracture. IMPRESSION: 1. No gross acute fracture identified. 2. Gross deformity at the carpal joint which may be secondary to remote trauma or advanced arthritis. Electronically Signed   By: Donavan Foil M.D.   On: 11/15/2016 02:28   Pelvis Portable  Result Date: 11/15/2016 CLINICAL DATA:  Right hip hemiarthroplasty. EXAM: PORTABLE PELVIS 1-2 VIEWS COMPARISON:  Right hip radiograph 11/14/2016 FINDINGS: The patient has undergone right hip hemiarthroplasty. There is no evidence of immediate complication. The components appear well seated. IMPRESSION: Status post right hip hemiarthroplasty without evidence of immediate hardware  complication. Electronically Signed   By: Ulyses Jarred M.D.   On: 11/15/2016 21:06   Dg Shoulder Right Port  Result Date: 11/15/2016 CLINICAL DATA:  Status post fall with right posterior rib pain an diffuse arm pain. EXAM: PORTABLE RIGHT SHOULDER COMPARISON:  None. FINDINGS: No acute fracture or shoulder dislocation identified. The acromioclavicular interval is maintained. Bones are demineralized. IMPRESSION: Negative. Electronically Signed   By: Kristine Garbe M.D.   On: 11/15/2016 02:24   Dg Hip Unilat  With Pelvis 2-3 Views Right  Result Date: 11/14/2016 CLINICAL DATA:  83 y/o  F; right hip and wrist pain after fall. EXAM: DG HIP (WITH OR WITHOUT PELVIS) 2-3V RIGHT COMPARISON:  None. FINDINGS: Right femoral neck impacted and mildly displaced acute fracture with coxa vera deformity. The femoral head is well seated in the hip joint. No other fracture is identified. IMPRESSION: Right femoral neck impacted and mildly displaced acute fracture with coxa vera deformity. No hip dislocation. Electronically Signed   By: Kristine Garbe M.D.   On: 11/14/2016 22:47   Scheduled Meds: . aspirin EC  325 mg Oral BID  . docusate sodium  100 mg Oral BID  . feeding supplement (ENSURE ENLIVE)  237 mL Oral Q24H  . levothyroxine  75 mcg Oral QAC breakfast  . mouth rinse  15 mL Mouth Rinse BID  . multivitamin with minerals  1 tablet Oral Daily  . senna  1 tablet Oral BID   Continuous Infusions: . sodium chloride 50 mL/hr at 11/15/16 2205    LOS: 1 day   Vance Gather, MD Pager # [phone] If 7PM-7AM, please contact night-coverage [email or web address] Password [email or web address] 11/16/2016, 2:50 PM

## 2016-11-16 NOTE — Progress Notes (Signed)
     Subjective: 1 Day Post-Op Procedure(s) (LRB): ARTHROPLASTY BIPOLAR HIP (HEMIARTHROPLASTY) (Right)   Patient expressing no real pain.  No reported events throughout the night.  Sleeping comfortably in bed.  Objective:   VITALS:   Vitals:   11/16/16 0020 11/16/16 0533  BP: 132/75 120/71  Pulse: 97 87  Resp: 16 14  Temp: 98.3 F (36.8 C) 98.1 F (36.7 C)    Dorsiflexion/Plantar flexion intact Incision: dressing C/D/I No cellulitis present Compartment soft  LABS  Recent Labs  11/14/16 2317 11/16/16 0452  HGB 12.7 10.9*  HCT 39.7 34.7*  WBC 19.0* 11.7*  PLT 402* 289     Recent Labs  11/14/16 2317 11/16/16 0452  NA 138 137  K 3.6 3.8  BUN 18 14  CREATININE 0.69 0.71  GLUCOSE 166* 123*     Assessment/Plan: 1 Day Post-Op Procedure(s) (LRB): ARTHROPLASTY BIPOLAR HIP (HEMIARTHROPLASTY) (Right) Up with therapy Discharge disposition to be determined  Ortho recommendations:  ASA 325 mg bid for 4 weeks for anticoagulation, unless other medically indicated.  Tramadol for pain management (Rx written).  MiraLax and Colace for constipation  Iron 325 mg tid for 2-3 weeks   PWB   50% on the right leg.  Dressing to remain in place until follow in clinic in 2 weeks.  Dressing is waterproof and may shower with it in place.  Follow up in 2 weeks at [facility]. Follow up with OLIN,Eyva Califano D in 2 weeks.  Contact information:  [facility] [address], [address] [place]   PAC  11/16/2016, 7:41 AM

## 2016-11-17 LAB — CBC
HCT: 34.5 % — ABNORMAL LOW (ref 36.0–46.0)
HEMOGLOBIN: 10.8 g/dL — AB (ref 12.0–15.0)
MCH: 30.5 pg (ref 26.0–34.0)
MCHC: 31.3 g/dL (ref 30.0–36.0)
MCV: 97.5 fL (ref 78.0–100.0)
Platelets: 261 10*3/uL (ref 150–400)
RBC: 3.54 MIL/uL — AB (ref 3.87–5.11)
RDW: 13.8 % (ref 11.5–15.5)
WBC: 10.3 10*3/uL (ref 4.0–10.5)

## 2016-11-17 LAB — BASIC METABOLIC PANEL
Anion gap: 6 (ref 5–15)
BUN: 23 mg/dL — AB (ref 6–20)
CALCIUM: 8.5 mg/dL — AB (ref 8.9–10.3)
CO2: 30 mmol/L (ref 22–32)
CREATININE: 0.77 mg/dL (ref 0.44–1.00)
Chloride: 103 mmol/L (ref 101–111)
GFR calc Af Amer: >60 mL/min (ref >60)
GLUCOSE: 129 mg/dL — AB (ref 65–99)
Potassium: 4 mmol/L (ref 3.5–5.1)
Sodium: 139 mmol/L (ref 135–145)

## 2016-11-17 MED ORDER — TRAMADOL HCL 50 MG PO TABS: 50.0000 mg | ORAL_TABLET | Freq: Four times a day (QID) | ORAL | Status: DC | PRN

## 2016-11-17 MED ORDER — METOPROLOL SUCCINATE ER 25 MG PO TB24: 12.5000 mg | ORAL_TABLET | Freq: Every day | ORAL | Status: DC

## 2016-11-17 MED ORDER — SENNA 8.6 MG PO TABS: 1.0000 | ORAL_TABLET | Freq: Two times a day (BID) | ORAL | 0 refills | Status: AC | PRN

## 2016-11-17 MED ORDER — METOPROLOL SUCCINATE ER 25 MG PO TB24: 12.5000 mg | ORAL_TABLET | Freq: Every day | ORAL | Status: AC

## 2016-11-17 NOTE — Progress Notes (Addendum)
Lamar Blinks, NP made aware of new swelling in pt's left arm below the [facility] to pt hand. An IV was placed in the OR in the left [facility]. Requested an US of the left arm for possible DVT. No new orders given. New IV access established on Right Arm. L AC IV taken out. Will continue to monitor closely

## 2016-11-17 NOTE — Anesthesia Postprocedure Evaluation (Addendum)
Anesthesia Post Note  Patient: [name]  Procedure(s) Performed: Procedure(s) (LRB): ARTHROPLASTY BIPOLAR HIP (HEMIARTHROPLASTY) (Right)  Patient location during evaluation: PACU Anesthesia Type: General Level of consciousness: awake and alert Pain management: pain level controlled Vital Signs Assessment: post-procedure vital signs reviewed and stable Respiratory status: spontaneous breathing, nonlabored ventilation, respiratory function stable and patient connected to nasal cannula oxygen Cardiovascular status: blood pressure returned to baseline and stable Postop Assessment: no signs of nausea or vomiting Anesthetic complications: no       Last Vitals:  Vitals:   11/16/16 2140 11/17/16 0517  BP: 113/67 112/73  Pulse: (!) 106 (!) 105  Resp: 16 16  Temp: 37.1 C 36.6 C    Last Pain:  Vitals:   11/17/16 0517  TempSrc: Oral  PainSc:                  [name]

## 2016-11-17 NOTE — Clinical Social Work Placement (Signed)
Medical Social Worker facilitated patient discharge including contacting patient family and facility to confirm patient discharge plans.  Clinical information faxed to facility and family agreeable with plan.  MSW arranged ambulance transport via [facility] to [facility].  RN to call report prior to discharge.  Medical Social Worker will sign off for now as social work intervention is no longer needed. Please consult [place] again if new need arises.   CLINICAL SOCIAL WORK PLACEMENT  NOTE  Date:  11/17/2016  Patient Details  Name: [name] MRN: [id number] Date of Birth: [birth year]  Clinical Social Work is seeking post-discharge placement for this patient at the [place] level of care (*CSW will initial, date and re-position this form in  chart as items are completed):  Yes   Patient/family provided with [place] Work Department's list of facilities offering this level of care within the geographic area requested by the patient (or if unable, by the patient's family).  Yes   Patient/family informed of their freedom to choose among providers that offer the needed level of care, that participate in Medicare, Medicaid or managed care program needed by the patient, have an available bed and are willing to accept the patient.  Yes   Patient/family informed of [facility]'s ownership interest in [facility] and [facility], as well as of the fact that they are under no obligation to receive care at these facilities.  PASRR submitted to EDS on       PASRR number received on       Existing PASRR number confirmed on 11/16/16     FL2 transmitted to all facilities in geographic area requested by pt/family on 11/16/16     FL2 transmitted to all facilities within larger geographic area on       Patient informed that his/her managed care company has contracts with or will negotiate with certain facilities, including the following:        Yes    Patient/family informed of bed offers received.  Patient chooses bed at  [facility] )     Physician recommends and patient chooses bed at      Patient to be transferred to  [facility] ) on 11/17/16.  Patient to be transferred to facility by  [facility] )     Patient family notified on 11/17/16 of transfer.  Name of family member notified:   (Pt's son, [name])     PHYSICIAN Please prepare priority discharge summary, including medications, Please sign FL2     Additional Comment:    _______________________________________________ Glendon Axe A 11/17/2016, 3:27 PM

## 2016-11-17 NOTE — Progress Notes (Signed)
Patient ID: [name], female   DOB: 1927-05-01, 83 y.o.   MRN: [id number] Subjective: 2 Days Post-Op Procedure(s) (LRB): ARTHROPLASTY BIPOLAR HIP (HEMIARTHROPLASTY) (Right)    Patient reports pain as mild. Relatively comfortable this am though I woke her from sleeping.  Minimal activity to date.  Objective:   VITALS:   Vitals:   11/16/16 2140 11/17/16 0517  BP: 113/67 112/73  Pulse: (!) 106 (!) 105  Resp: 16 16  Temp: 98.7 F (37.1 C) 97.9 F (36.6 C)    Neurovascular intact Incision: dressing C/D/I  LABS  Recent Labs  11/14/16 2317 11/16/16 0452 11/17/16 0440  HGB 12.7 10.9* 10.8*  HCT 39.7 34.7* 34.5*  WBC 19.0* 11.7* 10.3  PLT 402* 289 261     Recent Labs  11/14/16 2317 11/16/16 0452 11/17/16 0440  NA 138 137 139  K 3.6 3.8 4.0  BUN 18 14 23*  CREATININE 0.69 0.71 0.77  GLUCOSE 166* 123* 129*     Recent Labs  11/15/16 0228 11/15/16 0530  INR 1.12 1.17     Assessment/Plan: 2 Days Post-Op Procedure(s) (LRB): ARTHROPLASTY BIPOLAR HIP (HEMIARTHROPLASTY) (Right)   Advance diet Up with therapy Discharge to SNF most likely at discharge, SW can work with family about options as well as potential long term situation due to the fact she currently live independently with her husband who has medical issues as well

## 2017-02-02 ENCOUNTER — Emergency Department (HOSPITAL_COMMUNITY)
Admission: EM | Admit: 2017-02-02 | Discharge: 2017-02-02 | Disposition: A | Discharge status: Home / Self Care | Attending: Emergency Medicine | Admitting: Emergency Medicine

## 2017-02-02 ENCOUNTER — Emergency Department (HOSPITAL_COMMUNITY)

## 2017-02-02 ENCOUNTER — Encounter (HOSPITAL_COMMUNITY): Payer: Self-pay

## 2017-02-02 DIAGNOSIS — R319 Hematuria, unspecified: Secondary | ICD-10-CM | POA: Insufficient documentation

## 2017-02-02 DIAGNOSIS — G309 Alzheimer's disease, unspecified: Secondary | ICD-10-CM | POA: Diagnosis not present

## 2017-02-02 DIAGNOSIS — R55 Syncope and collapse: Secondary | ICD-10-CM | POA: Diagnosis not present

## 2017-02-02 DIAGNOSIS — E039 Hypothyroidism, unspecified: Secondary | ICD-10-CM | POA: Diagnosis not present

## 2017-02-02 DIAGNOSIS — Z79899 Other long term (current) drug therapy: Secondary | ICD-10-CM | POA: Diagnosis not present

## 2017-02-02 DIAGNOSIS — N390 Urinary tract infection, site not specified: Secondary | ICD-10-CM | POA: Diagnosis not present

## 2017-02-02 DIAGNOSIS — Z853 Personal history of malignant neoplasm of breast: Secondary | ICD-10-CM | POA: Diagnosis not present

## 2017-02-02 LAB — URINALYSIS, ROUTINE W REFLEX MICROSCOPIC
Bilirubin Urine: NEGATIVE
Glucose, UA: NEGATIVE mg/dL
KETONES UR: NEGATIVE mg/dL
Nitrite: NEGATIVE
PROTEIN: NEGATIVE mg/dL
Specific Gravity, Urine: 1.013 (ref 1.005–1.030)
pH: 6 (ref 5.0–8.0)

## 2017-02-02 LAB — CBC WITH DIFFERENTIAL/PLATELET
BASOS ABS: 0 10*3/uL (ref 0.0–0.1)
Basophils Relative: 0 %
EOS PCT: 2 %
Eosinophils Absolute: 0.1 10*3/uL (ref 0.0–0.7)
HCT: 38.6 % (ref 36.0–46.0)
Hemoglobin: 11.8 g/dL — ABNORMAL LOW (ref 12.0–15.0)
LYMPHS ABS: 1.1 10*3/uL (ref 0.7–4.0)
LYMPHS PCT: 13 %
MCH: 29.9 pg (ref 26.0–34.0)
MCHC: 30.6 g/dL (ref 30.0–36.0)
MCV: 98 fL (ref 78.0–100.0)
Monocytes Absolute: 0.4 10*3/uL (ref 0.1–1.0)
Monocytes Relative: 4 %
Neutro Abs: 7 10*3/uL (ref 1.7–7.7)
Neutrophils Relative %: 81 %
PLATELETS: 406 10*3/uL — AB (ref 150–400)
RBC: 3.94 MIL/uL (ref 3.87–5.11)
RDW: 14 % (ref 11.5–15.5)
WBC: 8.6 10*3/uL (ref 4.0–10.5)

## 2017-02-02 LAB — BASIC METABOLIC PANEL
Anion gap: 6 (ref 5–15)
BUN: 14 mg/dL (ref 6–20)
CALCIUM: 8.7 mg/dL — AB (ref 8.9–10.3)
CO2: 30 mmol/L (ref 22–32)
CREATININE: 0.78 mg/dL (ref 0.44–1.00)
Chloride: 105 mmol/L (ref 101–111)
GFR calc Af Amer: >60 mL/min (ref >60)
GLUCOSE: 136 mg/dL — AB (ref 65–99)
Potassium: 3.5 mmol/L (ref 3.5–5.1)
Sodium: 141 mmol/L (ref 135–145)

## 2017-02-02 LAB — I-STAT TROPONIN, ED: Troponin i, poc: 0 ng/mL (ref 0.00–0.08)

## 2017-02-02 MED ORDER — CEPHALEXIN 500 MG PO CAPS: 500.0000 mg | ORAL_CAPSULE | Freq: Two times a day (BID) | ORAL | 0 refills | Status: AC

## 2017-02-02 MED ORDER — CEPHALEXIN 250 MG PO CAPS
500.0000 mg | ORAL_CAPSULE | Freq: Once | ORAL | Status: AC
  Administered 2017-02-02: 500 mg via ORAL
  Filled 2017-02-02: qty 2

## 2017-02-02 NOTE — ED Triage Notes (Signed)
Pt brought in by EMS due to having unresponsive episode while sitting at the table with husband. Per EMS pt slumped in chair an would not response to husband. Per EMS pt did not hit head or fall out of chair. Pt did have episode of vomiting and then was arousable by voice. Pt has hx of dementia. Pt a&ox2.

## 2017-02-02 NOTE — ED Notes (Signed)
Contacted [facility] for transport to home

## 2017-02-02 NOTE — ED Notes (Signed)
Patient transported to X-ray 

## 2017-02-02 NOTE — ED Provider Notes (Signed)
[facility] DEPT Provider Note   CSN: [id number] Arrival date & time: 02/02/17  1134     History   Chief Complaint Chief Complaint  Patient presents with  . Loss of Consciousness    HPI [name] is a 83 y.o. female.  The history is provided by the patient and medical records.    LEVEL V CAVEAT: DEMENTIA 83 y.o. F with hx of dementia, remote hx of breast cancer in the 1970's, thyroid disease, RA, gout, MGUS, presenting to the ED following a syncopal event.   Patient has been at the bedside reports he thinks her breakfast this morning and she started to eat but then slumped over in the chair and went unresponsive. States their home CNA was present who came to the table to assist. States patient appeared sweaty and then vomited. She did complain of some neck and arm pain, however when questioned further about this she stated "I don't know". Husband reports she tends to have random body pain so this is not necessarily abnormal for her. States she came to a little better after she vomited. States she has not had any other falls or syncopal events recently. Patient just came home from [place] 3 weeks ago after being placed on home hospice for advanced dementia. Family has not noticed any recent illness or fevers. States she is overall benign her baseline. Reports he ambulates very little at baseline, is able to shop a few feet but uses assistance of walker when doing so.  Past Medical History:  Diagnosis Date  . Alzheimer's dementia   . Arthritis   . Cancer [facility])    breast CA  . Gout   . Hypothyroidism   . Incontinence of urine   . Rheumatoid arthritis(714.0)   . Short-term memory loss   . Thyroid disease   . Weakness of both legs     Patient Active Problem List   Diagnosis Date Noted  . Closed right hip fracture ([place]) 11/15/2016  . Atrial fibrillation ([place]) 11/15/2016  . Pain   . MGUS (monoclonal gammopathy of unknown significance) 01/21/2014  .  History of breast cancer 01/21/2014  . Back pain 08/09/2013  . Severe protein-calorie malnutrition ([place]) 08/08/2013  . Palliative care encounter 08/08/2013  . Weakness generalized 08/08/2013  . Fall 08/07/2013  . Syncope 08/07/2013  . Dementia 08/07/2013  . Failure to thrive in adult 08/07/2013    Past Surgical History:  Procedure Laterality Date  . ABDOMINAL HYSTERECTOMY    . BREAST SURGERY  1974   mastectomy left  . HIP ARTHROPLASTY Right 11/15/2016   Procedure: ARTHROPLASTY BIPOLAR HIP (HEMIARTHROPLASTY);  Surgeon: Paralee Cancel, MD;  Location: [facility] ORS;  Service: Orthopedics;  Laterality: Right;  . lumbar fracture    . MASTECTOMY  1974   left breast    OB History    No data available       Home Medications    Prior to Admission medications   Medication Sig Start Date End Date Taking? Authorizing Provider  acetaminophen (TYLENOL) 500 MG tablet Take 500 mg by mouth every 6 (six) hours as needed.    Historical Provider, MD  Calcium Carbonate-Vitamin D (CALCIUM + D PO) Take 1 tablet by mouth daily.     Historical Provider, MD  levothyroxine (SYNTHROID, LEVOTHROID) 50 MCG tablet Take 1.5 tablets (75 mcg total) by mouth daily before breakfast. 08/08/13   Charlynne Cousins, MD  metoprolol succinate (TOPROL-XL) 25 MG 24 hr tablet Take 0.5 tablets (12.5  mg total) by mouth daily. 11/17/16   Patrecia Pour, MD  senna (SENOKOT) 8.6 MG TABS tablet Take 1 tablet (8.6 mg total) by mouth 2 (two) times daily as needed for mild constipation. 11/17/16   Patrecia Pour, MD  traMADol (ULTRAM) 50 MG tablet Take 1-2 tablets (50-100 mg total) by mouth every 6 (six) hours as needed for moderate pain. 11/16/16   Danae Orleans, PA-C    Family History Family History  Problem Relation Age of Onset  . Cancer Sister     unknown cancer    Social History Social History  Substance Use Topics  . Smoking status: Never Smoker  . Smokeless tobacco: Never Used  . Alcohol use No     Allergies   Patient has  no known allergies.   Review of Systems Review of Systems  Unable to perform ROS: Dementia     Physical Exam Updated Vital Signs BP 112/75 (BP Location: Right Arm)   Pulse 92   Temp 97.4 F (36.3 C) (Oral)   Resp (!) 26   Ht 5' (1.524 m)   Wt 38.6 kg   SpO2 93%   BMI 16.60 kg/m   Physical Exam  Constitutional: She appears well-developed and well-nourished.  Elderly, awake, alert  HENT:  Head: Normocephalic and atraumatic.  Mouth/Throat: Oropharynx is clear and moist.  Eyes: Conjunctivae and EOM are normal. Pupils are equal, round, and reactive to light.  Neck: Normal range of motion.  Cardiovascular: Normal rate, regular rhythm and normal heart sounds.   Pulmonary/Chest: Effort normal and breath sounds normal.  Abdominal: Soft. Bowel sounds are normal.  Musculoskeletal: Normal range of motion.  Neurological: She is alert.  Awake, oriented to self and situation but not time/date, moving her extremities when prompted, following commands, no apparent large deficits  Skin: Skin is warm and dry.  Psychiatric: She has a normal mood and affect.  Nursing note and vitals reviewed.    ED Treatments / Results  Labs (all labs ordered are listed, but only abnormal results are displayed) Labs Reviewed  CBC WITH DIFFERENTIAL/PLATELET - Abnormal; Notable for the following:       Result Value   Hemoglobin 11.8 (*)    Platelets 406 (*)    All other components within normal limits  BASIC METABOLIC PANEL - Abnormal; Notable for the following:    Glucose, Bld 136 (*)    Calcium 8.7 (*)    All other components within normal limits  URINALYSIS, ROUTINE W REFLEX MICROSCOPIC - Abnormal; Notable for the following:    Color, Urine AMBER (*)    APPearance CLOUDY (*)    Hgb urine dipstick MODERATE (*)    Leukocytes, UA LARGE (*)    Bacteria, UA MANY (*)    Squamous Epithelial / LPF 0-5 (*)    All other components within normal limits  URINE CULTURE  I-STAT TROPOININ, ED    EKG   EKG Interpretation None       Radiology Dg Chest 2 View  Result Date: 02/02/2017 CLINICAL DATA:  Syncope. EXAM: CHEST  2 VIEW COMPARISON:  Radiograph of November 15, 2016 and September 18, 2015. FINDINGS: Stable cardiomediastinal silhouette. Atherosclerosis of thoracic aorta is noted. No pneumothorax noted. No significant pleural effusion is noted. No acute pulmonary disease is noted. Severe degenerative changes seen involving the left glenohumeral joint. Old upper thoracic vertebral body compression fracture is noted. IMPRESSION: No active cardiopulmonary disease.  Aortic atherosclerosis. Electronically Signed   By: Marijo Conception,  M.D.   On: 02/02/2017 12:34    Procedures Procedures (including critical care time)  Medications Ordered in ED Medications - No data to display   Initial Impression / Assessment and Plan / ED Course  I have reviewed the triage vital signs and the nursing notes.  Pertinent labs & imaging results that were available during my care of the patient were reviewed by me and considered in my medical decision making (see chart for details).  83 year old female here following a syncopal event. She is on home hospice for advanced stage dementia. Patient is awake, alert, oriented to self and place here but not date or time. This is her baseline per husband at bedside.  She did not have any large deficits on my exam. EKG is nonischemic. Screening lab work is reassuring. UA was obtained which does appear infectious. A culture will be sent. I discussed options of observation admission with patient's husband and her son at the bedside for synocpe. They're concerned with observation admission it will be recommended that she go back to a nursing facility and she may lose her home hospice placement. They would prefer for her to have treatment for UTI and be discharged home. I feel this is reasonable.  Will start Keflex pending urine culture. Close follow-up with PCP. They were given  strict return precautions for any new or worsening symptoms.  Case discussed with attending physician, Dr. Jeneen Rinks, who agrees with assessment and plan of care.  Final Clinical Impressions(s) / ED Diagnoses   Final diagnoses:  Syncope, unspecified syncope type  Urinary tract infection with hematuria, site unspecified    New Prescriptions New Prescriptions   CEPHALEXIN (KEFLEX) 500 MG CAPSULE    Take 1 capsule (500 mg total) by mouth 2 (two) times daily.     Larene Pickett, PA-C 02/02/17 1518    Tanna Furry, MD 02/17/17 [phone]

## 2017-02-02 NOTE — ED Notes (Signed)
[facility] contacted to transport patient home. 

## 2017-02-02 NOTE — ED Notes (Signed)
Pt incontinent of stool. Peri care performed and incontinent pad changed.

## 2017-02-02 NOTE — Discharge Instructions (Signed)
Take the prescribed medication as directed for UTI. Follow-up with your primary care doctor. Return to the ED for new or worsening symptoms--- recurrent syncope, chest pain, labored breathing, high fever, vomiting, etc.

## 2017-02-02 NOTE — Progress Notes (Addendum)
[facility] Liaison:  RN visit  Visited with patient, her husband and son in ED.  Patient was alert, but pleasantly confused.   She could not recall any of the morning events or why she was in the hospital.  I discussed with her how she was feeling and she advised "I feel good".  Husband states that she "sort of had a blank look on her face and home and couldn't tell me what was wrong".  Patient states she threw up very shortly afterwards.  Patient was responding to her husband throughout according to him.    Husband states he did not call hospice prior to calling 911, "because I got scared and I didn't remember".  Husband extremely pleasant.  Emotional support given to ease husbands mind.    Discussed with Cori Razor, as to whether they should do in/out cath for urine specimen.  They will only do if necessary.    It was discussed that patient may be admitted for syncopal episode, but patient/family wants to go home.   I advised ED of the same.    Thank you,  Edyth Gunnels, RN, [facility] Liaison [phone]  All hospital liaison's are now on [place].

## 2017-02-04 LAB — URINE CULTURE: Culture: >=100000 — AB

## 2017-02-05 ENCOUNTER — Telehealth: Payer: Self-pay | Admitting: Emergency Medicine

## 2017-02-05 NOTE — Telephone Encounter (Signed)
Post ED Visit - Positive Culture Follow-up  Culture report reviewed by antimicrobial stewardship pharmacist:  []  Elenor Quinones, Pharm.D. [x]  Heide Guile, Pharm.D., BCPS AQ-ID []  Parks Neptune, Pharm.D., BCPS []  Alycia Rossetti, Pharm.D., BCPS []  [place], Pharm.D., BCPS, AAHIVP []  Legrand Como, Pharm.D., BCPS, AAHIVP []  Salome Arnt, PharmD, BCPS []  Dimitri Ped, PharmD, BCPS []  Vincenza Hews, PharmD, BCPS  Positive urine culture Treated with cephalexin, organism sensitive to the same and no further patient follow-up is required at this time.  Hazle Nordmann 02/05/2017, 11:52 AM

## 2017-03-12 NOTE — Addendum Note (Signed)
Addendum  created 03/12/17 1055 by Myrtie Soman, MD   Sign clinical note

## 2019-07-10 DEATH — deceased
